# Patient Record
Sex: Male | Born: 1945
Health system: Southern US, Community
[De-identification: ages and names within clinical notes are randomized; demographics above are authoritative.]

## PROBLEM LIST (undated history)

## (undated) DIAGNOSIS — N289 Disorder of kidney and ureter, unspecified: Secondary | ICD-10-CM

## (undated) DIAGNOSIS — J449 Chronic obstructive pulmonary disease, unspecified: Secondary | ICD-10-CM

## (undated) DIAGNOSIS — I519 Heart disease, unspecified: Secondary | ICD-10-CM

## (undated) DIAGNOSIS — N189 Chronic kidney disease, unspecified: Secondary | ICD-10-CM

## (undated) DIAGNOSIS — I2510 Atherosclerotic heart disease of native coronary artery without angina pectoris: Secondary | ICD-10-CM

## (undated) DIAGNOSIS — G4730 Sleep apnea, unspecified: Secondary | ICD-10-CM

## (undated) DIAGNOSIS — K219 Gastro-esophageal reflux disease without esophagitis: Secondary | ICD-10-CM

## (undated) DIAGNOSIS — M109 Gout, unspecified: Secondary | ICD-10-CM

## (undated) DIAGNOSIS — E785 Hyperlipidemia, unspecified: Secondary | ICD-10-CM

## (undated) DIAGNOSIS — E119 Type 2 diabetes mellitus without complications: Secondary | ICD-10-CM

## (undated) DIAGNOSIS — I10 Essential (primary) hypertension: Secondary | ICD-10-CM

## (undated) HISTORY — DX: Essential (primary) hypertension: I10

## (undated) HISTORY — DX: Gastro-esophageal reflux disease without esophagitis: K21.9

## (undated) HISTORY — DX: Disorder of kidney and ureter, unspecified: N28.9

## (undated) HISTORY — DX: Atherosclerotic heart disease of native coronary artery without angina pectoris: I25.10

## (undated) HISTORY — DX: Sleep apnea, unspecified: G47.30

## (undated) HISTORY — DX: Chronic obstructive pulmonary disease, unspecified: J44.9

## (undated) HISTORY — DX: Chronic kidney disease, unspecified: N18.9

## (undated) HISTORY — DX: Heart disease, unspecified: I51.9

## (undated) HISTORY — DX: Gout, unspecified: M10.9

## (undated) HISTORY — DX: Hyperlipidemia, unspecified: E78.5

## (undated) HISTORY — DX: Type 2 diabetes mellitus without complications: E11.9

---

## 2011-01-23 DIAGNOSIS — N189 Chronic kidney disease, unspecified: Secondary | ICD-10-CM | POA: Diagnosis not present

## 2011-01-26 DIAGNOSIS — N189 Chronic kidney disease, unspecified: Secondary | ICD-10-CM | POA: Diagnosis not present

## 2011-02-27 DIAGNOSIS — M109 Gout, unspecified: Secondary | ICD-10-CM | POA: Diagnosis not present

## 2011-02-27 DIAGNOSIS — E782 Mixed hyperlipidemia: Secondary | ICD-10-CM | POA: Diagnosis not present

## 2011-02-27 DIAGNOSIS — N189 Chronic kidney disease, unspecified: Secondary | ICD-10-CM | POA: Diagnosis not present

## 2011-04-20 DIAGNOSIS — Z006 Encounter for examination for normal comparison and control in clinical research program: Secondary | ICD-10-CM | POA: Diagnosis not present

## 2011-04-20 DIAGNOSIS — M109 Gout, unspecified: Secondary | ICD-10-CM | POA: Diagnosis not present

## 2011-05-29 DIAGNOSIS — K219 Gastro-esophageal reflux disease without esophagitis: Secondary | ICD-10-CM | POA: Diagnosis not present

## 2011-05-29 DIAGNOSIS — I10 Essential (primary) hypertension: Secondary | ICD-10-CM | POA: Diagnosis not present

## 2011-05-29 DIAGNOSIS — N189 Chronic kidney disease, unspecified: Secondary | ICD-10-CM | POA: Diagnosis not present

## 2011-06-19 DIAGNOSIS — E1129 Type 2 diabetes mellitus with other diabetic kidney complication: Secondary | ICD-10-CM | POA: Diagnosis not present

## 2012-03-25 DIAGNOSIS — I498 Other specified cardiac arrhythmias: Secondary | ICD-10-CM | POA: Diagnosis not present

## 2012-03-25 DIAGNOSIS — R55 Syncope and collapse: Secondary | ICD-10-CM | POA: Diagnosis not present

## 2012-03-27 DIAGNOSIS — I498 Other specified cardiac arrhythmias: Secondary | ICD-10-CM | POA: Diagnosis not present

## 2012-03-27 DIAGNOSIS — I2510 Atherosclerotic heart disease of native coronary artery without angina pectoris: Secondary | ICD-10-CM | POA: Diagnosis not present

## 2014-01-13 DIAGNOSIS — E119 Type 2 diabetes mellitus without complications: Secondary | ICD-10-CM | POA: Diagnosis not present

## 2014-01-13 DIAGNOSIS — I10 Essential (primary) hypertension: Secondary | ICD-10-CM | POA: Diagnosis not present

## 2014-01-13 DIAGNOSIS — H26493 Other secondary cataract, bilateral: Secondary | ICD-10-CM | POA: Diagnosis not present

## 2014-01-13 DIAGNOSIS — Z961 Presence of intraocular lens: Secondary | ICD-10-CM | POA: Diagnosis not present

## 2014-01-13 DIAGNOSIS — E11319 Type 2 diabetes mellitus with unspecified diabetic retinopathy without macular edema: Secondary | ICD-10-CM | POA: Diagnosis not present

## 2014-01-13 DIAGNOSIS — H5203 Hypermetropia, bilateral: Secondary | ICD-10-CM | POA: Diagnosis not present

## 2014-01-13 DIAGNOSIS — H52223 Regular astigmatism, bilateral: Secondary | ICD-10-CM | POA: Diagnosis not present

## 2014-01-28 DIAGNOSIS — E1129 Type 2 diabetes mellitus with other diabetic kidney complication: Secondary | ICD-10-CM | POA: Diagnosis not present

## 2014-01-29 DIAGNOSIS — H26492 Other secondary cataract, left eye: Secondary | ICD-10-CM | POA: Diagnosis not present

## 2014-02-05 DIAGNOSIS — R001 Bradycardia, unspecified: Secondary | ICD-10-CM | POA: Diagnosis not present

## 2014-02-06 DIAGNOSIS — I10 Essential (primary) hypertension: Secondary | ICD-10-CM | POA: Diagnosis not present

## 2014-02-06 DIAGNOSIS — M542 Cervicalgia: Secondary | ICD-10-CM | POA: Diagnosis not present

## 2014-02-06 DIAGNOSIS — E1165 Type 2 diabetes mellitus with hyperglycemia: Secondary | ICD-10-CM | POA: Diagnosis not present

## 2014-02-06 DIAGNOSIS — Z0000 Encounter for general adult medical examination without abnormal findings: Secondary | ICD-10-CM | POA: Diagnosis not present

## 2014-02-06 DIAGNOSIS — E782 Mixed hyperlipidemia: Secondary | ICD-10-CM | POA: Diagnosis not present

## 2014-02-06 DIAGNOSIS — E1129 Type 2 diabetes mellitus with other diabetic kidney complication: Secondary | ICD-10-CM | POA: Diagnosis not present

## 2014-02-10 DIAGNOSIS — Z961 Presence of intraocular lens: Secondary | ICD-10-CM | POA: Diagnosis not present

## 2014-02-10 DIAGNOSIS — H26492 Other secondary cataract, left eye: Secondary | ICD-10-CM | POA: Diagnosis not present

## 2014-02-10 DIAGNOSIS — I10 Essential (primary) hypertension: Secondary | ICD-10-CM | POA: Diagnosis not present

## 2014-02-25 DIAGNOSIS — E780 Pure hypercholesterolemia: Secondary | ICD-10-CM | POA: Diagnosis not present

## 2014-02-25 DIAGNOSIS — Z794 Long term (current) use of insulin: Secondary | ICD-10-CM | POA: Diagnosis not present

## 2014-02-25 DIAGNOSIS — E1165 Type 2 diabetes mellitus with hyperglycemia: Secondary | ICD-10-CM | POA: Diagnosis not present

## 2014-02-25 DIAGNOSIS — I10 Essential (primary) hypertension: Secondary | ICD-10-CM | POA: Diagnosis not present

## 2014-02-26 DIAGNOSIS — I2510 Atherosclerotic heart disease of native coronary artery without angina pectoris: Secondary | ICD-10-CM | POA: Diagnosis not present

## 2014-03-03 DIAGNOSIS — S76019A Strain of muscle, fascia and tendon of unspecified hip, initial encounter: Secondary | ICD-10-CM | POA: Diagnosis not present

## 2014-03-06 DIAGNOSIS — E1165 Type 2 diabetes mellitus with hyperglycemia: Secondary | ICD-10-CM | POA: Diagnosis not present

## 2014-03-26 DIAGNOSIS — N183 Chronic kidney disease, stage 3 (moderate): Secondary | ICD-10-CM | POA: Diagnosis not present

## 2014-03-26 DIAGNOSIS — I129 Hypertensive chronic kidney disease with stage 1 through stage 4 chronic kidney disease, or unspecified chronic kidney disease: Secondary | ICD-10-CM | POA: Diagnosis not present

## 2014-03-26 DIAGNOSIS — E1122 Type 2 diabetes mellitus with diabetic chronic kidney disease: Secondary | ICD-10-CM | POA: Diagnosis not present

## 2014-05-08 DIAGNOSIS — I498 Other specified cardiac arrhythmias: Secondary | ICD-10-CM | POA: Diagnosis not present

## 2014-06-02 DIAGNOSIS — E1129 Type 2 diabetes mellitus with other diabetic kidney complication: Secondary | ICD-10-CM | POA: Diagnosis not present

## 2014-06-11 DIAGNOSIS — N183 Chronic kidney disease, stage 3 (moderate): Secondary | ICD-10-CM | POA: Diagnosis not present

## 2014-06-11 DIAGNOSIS — E1129 Type 2 diabetes mellitus with other diabetic kidney complication: Secondary | ICD-10-CM | POA: Diagnosis not present

## 2014-06-11 DIAGNOSIS — I10 Essential (primary) hypertension: Secondary | ICD-10-CM | POA: Diagnosis not present

## 2014-06-11 DIAGNOSIS — E1165 Type 2 diabetes mellitus with hyperglycemia: Secondary | ICD-10-CM | POA: Diagnosis not present

## 2014-06-15 DIAGNOSIS — N183 Chronic kidney disease, stage 3 (moderate): Secondary | ICD-10-CM | POA: Diagnosis not present

## 2014-06-29 DIAGNOSIS — Z794 Long term (current) use of insulin: Secondary | ICD-10-CM | POA: Diagnosis not present

## 2014-06-29 DIAGNOSIS — E1122 Type 2 diabetes mellitus with diabetic chronic kidney disease: Secondary | ICD-10-CM | POA: Diagnosis not present

## 2014-06-29 DIAGNOSIS — I129 Hypertensive chronic kidney disease with stage 1 through stage 4 chronic kidney disease, or unspecified chronic kidney disease: Secondary | ICD-10-CM | POA: Diagnosis not present

## 2014-06-29 DIAGNOSIS — E780 Pure hypercholesterolemia: Secondary | ICD-10-CM | POA: Diagnosis not present

## 2014-06-29 DIAGNOSIS — N183 Chronic kidney disease, stage 3 (moderate): Secondary | ICD-10-CM | POA: Diagnosis not present

## 2014-07-28 DIAGNOSIS — R1314 Dysphagia, pharyngoesophageal phase: Secondary | ICD-10-CM | POA: Diagnosis not present

## 2014-07-28 DIAGNOSIS — K210 Gastro-esophageal reflux disease with esophagitis: Secondary | ICD-10-CM | POA: Diagnosis not present

## 2014-07-31 DIAGNOSIS — K449 Diaphragmatic hernia without obstruction or gangrene: Secondary | ICD-10-CM | POA: Diagnosis not present

## 2014-07-31 DIAGNOSIS — R1310 Dysphagia, unspecified: Secondary | ICD-10-CM | POA: Diagnosis not present

## 2014-08-11 DIAGNOSIS — Z45018 Encounter for adjustment and management of other part of cardiac pacemaker: Secondary | ICD-10-CM | POA: Diagnosis not present

## 2014-08-11 DIAGNOSIS — I498 Other specified cardiac arrhythmias: Secondary | ICD-10-CM | POA: Diagnosis not present

## 2014-08-12 DIAGNOSIS — E785 Hyperlipidemia, unspecified: Secondary | ICD-10-CM | POA: Diagnosis not present

## 2014-08-12 DIAGNOSIS — N189 Chronic kidney disease, unspecified: Secondary | ICD-10-CM | POA: Diagnosis not present

## 2014-08-12 DIAGNOSIS — K449 Diaphragmatic hernia without obstruction or gangrene: Secondary | ICD-10-CM | POA: Diagnosis not present

## 2014-08-12 DIAGNOSIS — J449 Chronic obstructive pulmonary disease, unspecified: Secondary | ICD-10-CM | POA: Diagnosis not present

## 2014-08-12 DIAGNOSIS — K2970 Gastritis, unspecified, without bleeding: Secondary | ICD-10-CM | POA: Diagnosis not present

## 2014-08-12 DIAGNOSIS — I129 Hypertensive chronic kidney disease with stage 1 through stage 4 chronic kidney disease, or unspecified chronic kidney disease: Secondary | ICD-10-CM | POA: Diagnosis not present

## 2014-08-12 DIAGNOSIS — G4730 Sleep apnea, unspecified: Secondary | ICD-10-CM | POA: Diagnosis not present

## 2014-08-12 DIAGNOSIS — K210 Gastro-esophageal reflux disease with esophagitis: Secondary | ICD-10-CM | POA: Diagnosis not present

## 2014-08-12 DIAGNOSIS — Z87891 Personal history of nicotine dependence: Secondary | ICD-10-CM | POA: Diagnosis not present

## 2014-08-12 DIAGNOSIS — Z794 Long term (current) use of insulin: Secondary | ICD-10-CM | POA: Diagnosis not present

## 2014-08-12 DIAGNOSIS — I2510 Atherosclerotic heart disease of native coronary artery without angina pectoris: Secondary | ICD-10-CM | POA: Diagnosis not present

## 2014-08-12 DIAGNOSIS — K2900 Acute gastritis without bleeding: Secondary | ICD-10-CM | POA: Diagnosis not present

## 2014-08-12 DIAGNOSIS — E119 Type 2 diabetes mellitus without complications: Secondary | ICD-10-CM | POA: Diagnosis not present

## 2014-08-12 DIAGNOSIS — M109 Gout, unspecified: Secondary | ICD-10-CM | POA: Diagnosis not present

## 2014-08-12 DIAGNOSIS — R1314 Dysphagia, pharyngoesophageal phase: Secondary | ICD-10-CM | POA: Diagnosis not present

## 2014-08-17 DIAGNOSIS — I2510 Atherosclerotic heart disease of native coronary artery without angina pectoris: Secondary | ICD-10-CM | POA: Diagnosis not present

## 2014-08-17 DIAGNOSIS — I10 Essential (primary) hypertension: Secondary | ICD-10-CM | POA: Diagnosis not present

## 2014-08-17 DIAGNOSIS — E785 Hyperlipidemia, unspecified: Secondary | ICD-10-CM | POA: Diagnosis not present

## 2014-08-17 DIAGNOSIS — Z01810 Encounter for preprocedural cardiovascular examination: Secondary | ICD-10-CM | POA: Diagnosis not present

## 2014-08-19 DIAGNOSIS — K449 Diaphragmatic hernia without obstruction or gangrene: Secondary | ICD-10-CM | POA: Diagnosis not present

## 2014-08-19 DIAGNOSIS — I10 Essential (primary) hypertension: Secondary | ICD-10-CM | POA: Diagnosis not present

## 2014-08-19 DIAGNOSIS — I25119 Atherosclerotic heart disease of native coronary artery with unspecified angina pectoris: Secondary | ICD-10-CM | POA: Diagnosis not present

## 2014-08-19 DIAGNOSIS — E119 Type 2 diabetes mellitus without complications: Secondary | ICD-10-CM | POA: Diagnosis not present

## 2014-09-01 DIAGNOSIS — I2510 Atherosclerotic heart disease of native coronary artery without angina pectoris: Secondary | ICD-10-CM | POA: Diagnosis not present

## 2014-09-01 DIAGNOSIS — Z01810 Encounter for preprocedural cardiovascular examination: Secondary | ICD-10-CM | POA: Diagnosis not present

## 2014-09-07 DIAGNOSIS — E785 Hyperlipidemia, unspecified: Secondary | ICD-10-CM | POA: Diagnosis not present

## 2014-09-07 DIAGNOSIS — I2510 Atherosclerotic heart disease of native coronary artery without angina pectoris: Secondary | ICD-10-CM | POA: Diagnosis not present

## 2014-09-07 DIAGNOSIS — Z950 Presence of cardiac pacemaker: Secondary | ICD-10-CM | POA: Diagnosis not present

## 2014-09-07 DIAGNOSIS — I10 Essential (primary) hypertension: Secondary | ICD-10-CM | POA: Diagnosis not present

## 2014-09-08 HISTORY — PX: ESOPHAGOGASTRODUODENOSCOPY: SHX1529

## 2014-09-09 DIAGNOSIS — Z951 Presence of aortocoronary bypass graft: Secondary | ICD-10-CM | POA: Diagnosis not present

## 2014-09-09 DIAGNOSIS — I252 Old myocardial infarction: Secondary | ICD-10-CM | POA: Diagnosis not present

## 2014-09-09 DIAGNOSIS — R9439 Abnormal result of other cardiovascular function study: Secondary | ICD-10-CM | POA: Diagnosis not present

## 2014-09-09 DIAGNOSIS — E785 Hyperlipidemia, unspecified: Secondary | ICD-10-CM | POA: Diagnosis not present

## 2014-09-09 DIAGNOSIS — Z950 Presence of cardiac pacemaker: Secondary | ICD-10-CM | POA: Diagnosis not present

## 2014-09-09 DIAGNOSIS — R001 Bradycardia, unspecified: Secondary | ICD-10-CM | POA: Diagnosis not present

## 2014-09-09 DIAGNOSIS — I2510 Atherosclerotic heart disease of native coronary artery without angina pectoris: Secondary | ICD-10-CM | POA: Diagnosis not present

## 2014-09-09 DIAGNOSIS — N189 Chronic kidney disease, unspecified: Secondary | ICD-10-CM | POA: Diagnosis not present

## 2014-09-09 DIAGNOSIS — I2582 Chronic total occlusion of coronary artery: Secondary | ICD-10-CM | POA: Diagnosis not present

## 2014-09-09 DIAGNOSIS — K449 Diaphragmatic hernia without obstruction or gangrene: Secondary | ICD-10-CM | POA: Diagnosis not present

## 2014-09-09 DIAGNOSIS — I440 Atrioventricular block, first degree: Secondary | ICD-10-CM | POA: Diagnosis not present

## 2014-09-09 DIAGNOSIS — R1310 Dysphagia, unspecified: Secondary | ICD-10-CM | POA: Diagnosis not present

## 2014-09-09 DIAGNOSIS — E119 Type 2 diabetes mellitus without complications: Secondary | ICD-10-CM | POA: Diagnosis not present

## 2014-09-09 DIAGNOSIS — Z794 Long term (current) use of insulin: Secondary | ICD-10-CM | POA: Diagnosis not present

## 2014-09-09 DIAGNOSIS — H9190 Unspecified hearing loss, unspecified ear: Secondary | ICD-10-CM | POA: Diagnosis not present

## 2014-09-09 DIAGNOSIS — Z87891 Personal history of nicotine dependence: Secondary | ICD-10-CM | POA: Diagnosis not present

## 2014-09-09 DIAGNOSIS — R079 Chest pain, unspecified: Secondary | ICD-10-CM | POA: Diagnosis not present

## 2014-09-09 DIAGNOSIS — Z79899 Other long term (current) drug therapy: Secondary | ICD-10-CM | POA: Diagnosis not present

## 2014-09-09 DIAGNOSIS — I129 Hypertensive chronic kidney disease with stage 1 through stage 4 chronic kidney disease, or unspecified chronic kidney disease: Secondary | ICD-10-CM | POA: Diagnosis not present

## 2014-09-09 DIAGNOSIS — I25708 Atherosclerosis of coronary artery bypass graft(s), unspecified, with other forms of angina pectoris: Secondary | ICD-10-CM | POA: Diagnosis not present

## 2014-09-16 DIAGNOSIS — K449 Diaphragmatic hernia without obstruction or gangrene: Secondary | ICD-10-CM | POA: Diagnosis not present

## 2014-09-16 DIAGNOSIS — I25119 Atherosclerotic heart disease of native coronary artery with unspecified angina pectoris: Secondary | ICD-10-CM | POA: Diagnosis not present

## 2014-09-16 DIAGNOSIS — E119 Type 2 diabetes mellitus without complications: Secondary | ICD-10-CM | POA: Diagnosis not present

## 2014-09-16 DIAGNOSIS — I10 Essential (primary) hypertension: Secondary | ICD-10-CM | POA: Diagnosis not present

## 2014-09-28 DIAGNOSIS — Z794 Long term (current) use of insulin: Secondary | ICD-10-CM | POA: Diagnosis not present

## 2014-09-28 DIAGNOSIS — T8859XA Other complications of anesthesia, initial encounter: Secondary | ICD-10-CM | POA: Diagnosis not present

## 2014-09-28 DIAGNOSIS — E780 Pure hypercholesterolemia: Secondary | ICD-10-CM | POA: Diagnosis present

## 2014-09-28 DIAGNOSIS — G4733 Obstructive sleep apnea (adult) (pediatric): Secondary | ICD-10-CM | POA: Diagnosis not present

## 2014-09-28 DIAGNOSIS — K440 Diaphragmatic hernia with obstruction, without gangrene: Secondary | ICD-10-CM | POA: Diagnosis not present

## 2014-09-28 DIAGNOSIS — I2510 Atherosclerotic heart disease of native coronary artery without angina pectoris: Secondary | ICD-10-CM | POA: Diagnosis not present

## 2014-09-28 DIAGNOSIS — I509 Heart failure, unspecified: Secondary | ICD-10-CM | POA: Diagnosis present

## 2014-09-28 DIAGNOSIS — Z79899 Other long term (current) drug therapy: Secondary | ICD-10-CM | POA: Diagnosis not present

## 2014-09-28 DIAGNOSIS — J811 Chronic pulmonary edema: Secondary | ICD-10-CM | POA: Diagnosis not present

## 2014-09-28 DIAGNOSIS — I129 Hypertensive chronic kidney disease with stage 1 through stage 4 chronic kidney disease, or unspecified chronic kidney disease: Secondary | ICD-10-CM | POA: Diagnosis present

## 2014-09-28 DIAGNOSIS — Z951 Presence of aortocoronary bypass graft: Secondary | ICD-10-CM | POA: Diagnosis not present

## 2014-09-28 DIAGNOSIS — E119 Type 2 diabetes mellitus without complications: Secondary | ICD-10-CM | POA: Diagnosis present

## 2014-09-28 DIAGNOSIS — J9602 Acute respiratory failure with hypercapnia: Secondary | ICD-10-CM | POA: Diagnosis not present

## 2014-09-28 DIAGNOSIS — I252 Old myocardial infarction: Secondary | ICD-10-CM | POA: Diagnosis not present

## 2014-09-28 DIAGNOSIS — J449 Chronic obstructive pulmonary disease, unspecified: Secondary | ICD-10-CM | POA: Diagnosis present

## 2014-09-28 DIAGNOSIS — Z23 Encounter for immunization: Secondary | ICD-10-CM | POA: Diagnosis not present

## 2014-09-28 DIAGNOSIS — I517 Cardiomegaly: Secondary | ICD-10-CM | POA: Diagnosis not present

## 2014-09-28 DIAGNOSIS — Z87891 Personal history of nicotine dependence: Secondary | ICD-10-CM | POA: Diagnosis not present

## 2014-09-28 DIAGNOSIS — Z9861 Coronary angioplasty status: Secondary | ICD-10-CM | POA: Diagnosis not present

## 2014-09-28 DIAGNOSIS — T4145XA Adverse effect of unspecified anesthetic, initial encounter: Secondary | ICD-10-CM | POA: Diagnosis not present

## 2014-09-28 DIAGNOSIS — K449 Diaphragmatic hernia without obstruction or gangrene: Secondary | ICD-10-CM | POA: Diagnosis not present

## 2014-09-28 DIAGNOSIS — R1310 Dysphagia, unspecified: Secondary | ICD-10-CM | POA: Diagnosis not present

## 2014-09-28 DIAGNOSIS — I25810 Atherosclerosis of coronary artery bypass graft(s) without angina pectoris: Secondary | ICD-10-CM | POA: Diagnosis present

## 2014-09-28 DIAGNOSIS — N189 Chronic kidney disease, unspecified: Secondary | ICD-10-CM | POA: Diagnosis not present

## 2014-09-28 DIAGNOSIS — Z950 Presence of cardiac pacemaker: Secondary | ICD-10-CM | POA: Diagnosis not present

## 2014-10-05 DIAGNOSIS — I2510 Atherosclerotic heart disease of native coronary artery without angina pectoris: Secondary | ICD-10-CM | POA: Diagnosis not present

## 2014-10-05 DIAGNOSIS — Z950 Presence of cardiac pacemaker: Secondary | ICD-10-CM | POA: Diagnosis not present

## 2014-10-05 DIAGNOSIS — I10 Essential (primary) hypertension: Secondary | ICD-10-CM | POA: Diagnosis not present

## 2014-10-05 DIAGNOSIS — E785 Hyperlipidemia, unspecified: Secondary | ICD-10-CM | POA: Diagnosis not present

## 2014-10-20 DIAGNOSIS — E1129 Type 2 diabetes mellitus with other diabetic kidney complication: Secondary | ICD-10-CM | POA: Diagnosis not present

## 2014-10-27 DIAGNOSIS — G4733 Obstructive sleep apnea (adult) (pediatric): Secondary | ICD-10-CM | POA: Diagnosis not present

## 2014-10-27 DIAGNOSIS — E1165 Type 2 diabetes mellitus with hyperglycemia: Secondary | ICD-10-CM | POA: Diagnosis not present

## 2014-10-27 DIAGNOSIS — E1129 Type 2 diabetes mellitus with other diabetic kidney complication: Secondary | ICD-10-CM | POA: Diagnosis not present

## 2014-10-27 DIAGNOSIS — N183 Chronic kidney disease, stage 3 (moderate): Secondary | ICD-10-CM | POA: Diagnosis not present

## 2014-10-30 DIAGNOSIS — E1165 Type 2 diabetes mellitus with hyperglycemia: Secondary | ICD-10-CM | POA: Diagnosis not present

## 2014-10-30 DIAGNOSIS — E7800 Pure hypercholesterolemia, unspecified: Secondary | ICD-10-CM | POA: Diagnosis not present

## 2014-10-30 DIAGNOSIS — E1122 Type 2 diabetes mellitus with diabetic chronic kidney disease: Secondary | ICD-10-CM | POA: Diagnosis not present

## 2014-10-30 DIAGNOSIS — Z794 Long term (current) use of insulin: Secondary | ICD-10-CM | POA: Diagnosis not present

## 2014-10-30 DIAGNOSIS — N183 Chronic kidney disease, stage 3 (moderate): Secondary | ICD-10-CM | POA: Diagnosis not present

## 2014-10-30 DIAGNOSIS — I10 Essential (primary) hypertension: Secondary | ICD-10-CM | POA: Diagnosis not present

## 2014-11-03 DIAGNOSIS — M1611 Unilateral primary osteoarthritis, right hip: Secondary | ICD-10-CM | POA: Diagnosis not present

## 2014-11-06 ENCOUNTER — Other Ambulatory Visit: Payer: Self-pay | Admitting: Orthopedic Surgery

## 2014-11-06 DIAGNOSIS — M1611 Unilateral primary osteoarthritis, right hip: Secondary | ICD-10-CM

## 2014-11-11 ENCOUNTER — Ambulatory Visit
Admission: RE | Admit: 2014-11-11 | Discharge: 2014-11-11 | Disposition: A | Discharge status: Home / Self Care | Payer: Medicare Other | Source: Ambulatory Visit | Attending: Orthopedic Surgery | Admitting: Orthopedic Surgery

## 2014-11-11 DIAGNOSIS — R1030 Lower abdominal pain, unspecified: Secondary | ICD-10-CM | POA: Diagnosis not present

## 2014-11-11 DIAGNOSIS — M1611 Unilateral primary osteoarthritis, right hip: Secondary | ICD-10-CM

## 2014-11-20 DIAGNOSIS — M1611 Unilateral primary osteoarthritis, right hip: Secondary | ICD-10-CM | POA: Diagnosis not present

## 2014-11-26 DIAGNOSIS — S76011D Strain of muscle, fascia and tendon of right hip, subsequent encounter: Secondary | ICD-10-CM | POA: Diagnosis not present

## 2014-11-26 DIAGNOSIS — M25551 Pain in right hip: Secondary | ICD-10-CM | POA: Diagnosis not present

## 2015-01-25 DIAGNOSIS — Z79899 Other long term (current) drug therapy: Secondary | ICD-10-CM | POA: Diagnosis not present

## 2015-01-25 DIAGNOSIS — Z125 Encounter for screening for malignant neoplasm of prostate: Secondary | ICD-10-CM | POA: Diagnosis not present

## 2015-01-25 DIAGNOSIS — E1129 Type 2 diabetes mellitus with other diabetic kidney complication: Secondary | ICD-10-CM | POA: Diagnosis not present

## 2015-02-09 DIAGNOSIS — Z0000 Encounter for general adult medical examination without abnormal findings: Secondary | ICD-10-CM | POA: Diagnosis not present

## 2015-02-09 DIAGNOSIS — E1165 Type 2 diabetes mellitus with hyperglycemia: Secondary | ICD-10-CM | POA: Diagnosis not present

## 2015-02-09 DIAGNOSIS — E782 Mixed hyperlipidemia: Secondary | ICD-10-CM | POA: Diagnosis not present

## 2015-02-09 DIAGNOSIS — E1129 Type 2 diabetes mellitus with other diabetic kidney complication: Secondary | ICD-10-CM | POA: Diagnosis not present

## 2015-02-09 DIAGNOSIS — I10 Essential (primary) hypertension: Secondary | ICD-10-CM | POA: Diagnosis not present

## 2015-03-02 DIAGNOSIS — E782 Mixed hyperlipidemia: Secondary | ICD-10-CM | POA: Diagnosis not present

## 2015-03-02 DIAGNOSIS — E1122 Type 2 diabetes mellitus with diabetic chronic kidney disease: Secondary | ICD-10-CM | POA: Diagnosis not present

## 2015-03-02 DIAGNOSIS — E1165 Type 2 diabetes mellitus with hyperglycemia: Secondary | ICD-10-CM | POA: Diagnosis not present

## 2015-03-02 DIAGNOSIS — N183 Chronic kidney disease, stage 3 (moderate): Secondary | ICD-10-CM | POA: Diagnosis not present

## 2015-03-02 DIAGNOSIS — Z794 Long term (current) use of insulin: Secondary | ICD-10-CM | POA: Diagnosis not present

## 2015-03-02 DIAGNOSIS — I129 Hypertensive chronic kidney disease with stage 1 through stage 4 chronic kidney disease, or unspecified chronic kidney disease: Secondary | ICD-10-CM | POA: Diagnosis not present

## 2015-04-08 DIAGNOSIS — S60512A Abrasion of left hand, initial encounter: Secondary | ICD-10-CM | POA: Diagnosis not present

## 2015-05-14 DIAGNOSIS — Z950 Presence of cardiac pacemaker: Secondary | ICD-10-CM | POA: Diagnosis not present

## 2015-06-03 DIAGNOSIS — K625 Hemorrhage of anus and rectum: Secondary | ICD-10-CM | POA: Diagnosis not present

## 2015-06-14 DIAGNOSIS — E1129 Type 2 diabetes mellitus with other diabetic kidney complication: Secondary | ICD-10-CM | POA: Diagnosis not present

## 2015-06-21 DIAGNOSIS — E1165 Type 2 diabetes mellitus with hyperglycemia: Secondary | ICD-10-CM | POA: Diagnosis not present

## 2015-06-21 DIAGNOSIS — K219 Gastro-esophageal reflux disease without esophagitis: Secondary | ICD-10-CM | POA: Diagnosis not present

## 2015-06-21 DIAGNOSIS — E1129 Type 2 diabetes mellitus with other diabetic kidney complication: Secondary | ICD-10-CM | POA: Diagnosis not present

## 2015-06-21 DIAGNOSIS — N183 Chronic kidney disease, stage 3 (moderate): Secondary | ICD-10-CM | POA: Diagnosis not present

## 2015-07-22 DIAGNOSIS — N189 Chronic kidney disease, unspecified: Secondary | ICD-10-CM | POA: Diagnosis not present

## 2015-07-22 DIAGNOSIS — E119 Type 2 diabetes mellitus without complications: Secondary | ICD-10-CM | POA: Diagnosis not present

## 2015-07-22 DIAGNOSIS — G4733 Obstructive sleep apnea (adult) (pediatric): Secondary | ICD-10-CM | POA: Diagnosis not present

## 2015-07-22 DIAGNOSIS — Z950 Presence of cardiac pacemaker: Secondary | ICD-10-CM | POA: Diagnosis not present

## 2015-07-22 DIAGNOSIS — K5730 Diverticulosis of large intestine without perforation or abscess without bleeding: Secondary | ICD-10-CM | POA: Diagnosis not present

## 2015-07-22 DIAGNOSIS — J449 Chronic obstructive pulmonary disease, unspecified: Secondary | ICD-10-CM | POA: Diagnosis not present

## 2015-07-22 DIAGNOSIS — I25810 Atherosclerosis of coronary artery bypass graft(s) without angina pectoris: Secondary | ICD-10-CM | POA: Diagnosis not present

## 2015-07-22 DIAGNOSIS — K648 Other hemorrhoids: Secondary | ICD-10-CM | POA: Diagnosis not present

## 2015-07-22 DIAGNOSIS — K629 Disease of anus and rectum, unspecified: Secondary | ICD-10-CM | POA: Diagnosis not present

## 2015-07-22 DIAGNOSIS — K625 Hemorrhage of anus and rectum: Secondary | ICD-10-CM | POA: Diagnosis not present

## 2015-07-22 DIAGNOSIS — K219 Gastro-esophageal reflux disease without esophagitis: Secondary | ICD-10-CM | POA: Diagnosis not present

## 2015-07-22 DIAGNOSIS — Q438 Other specified congenital malformations of intestine: Secondary | ICD-10-CM | POA: Diagnosis not present

## 2015-07-26 DIAGNOSIS — I2510 Atherosclerotic heart disease of native coronary artery without angina pectoris: Secondary | ICD-10-CM | POA: Diagnosis not present

## 2015-07-26 DIAGNOSIS — I10 Essential (primary) hypertension: Secondary | ICD-10-CM | POA: Diagnosis not present

## 2015-07-26 DIAGNOSIS — Z45018 Encounter for adjustment and management of other part of cardiac pacemaker: Secondary | ICD-10-CM | POA: Diagnosis not present

## 2015-07-26 DIAGNOSIS — I495 Sick sinus syndrome: Secondary | ICD-10-CM | POA: Diagnosis not present

## 2015-07-26 DIAGNOSIS — E785 Hyperlipidemia, unspecified: Secondary | ICD-10-CM | POA: Diagnosis not present

## 2015-07-27 DIAGNOSIS — I2510 Atherosclerotic heart disease of native coronary artery without angina pectoris: Secondary | ICD-10-CM | POA: Diagnosis not present

## 2015-07-27 DIAGNOSIS — E119 Type 2 diabetes mellitus without complications: Secondary | ICD-10-CM | POA: Diagnosis not present

## 2015-07-28 DIAGNOSIS — I2510 Atherosclerotic heart disease of native coronary artery without angina pectoris: Secondary | ICD-10-CM | POA: Diagnosis not present

## 2015-07-29 DIAGNOSIS — R609 Edema, unspecified: Secondary | ICD-10-CM | POA: Diagnosis not present

## 2015-08-02 DIAGNOSIS — R609 Edema, unspecified: Secondary | ICD-10-CM | POA: Diagnosis not present

## 2015-08-31 DIAGNOSIS — Z794 Long term (current) use of insulin: Secondary | ICD-10-CM | POA: Diagnosis not present

## 2015-08-31 DIAGNOSIS — E1165 Type 2 diabetes mellitus with hyperglycemia: Secondary | ICD-10-CM | POA: Diagnosis not present

## 2015-08-31 DIAGNOSIS — I129 Hypertensive chronic kidney disease with stage 1 through stage 4 chronic kidney disease, or unspecified chronic kidney disease: Secondary | ICD-10-CM | POA: Diagnosis not present

## 2015-08-31 DIAGNOSIS — E782 Mixed hyperlipidemia: Secondary | ICD-10-CM | POA: Diagnosis not present

## 2015-08-31 DIAGNOSIS — N183 Chronic kidney disease, stage 3 (moderate): Secondary | ICD-10-CM | POA: Diagnosis not present

## 2015-08-31 DIAGNOSIS — E1122 Type 2 diabetes mellitus with diabetic chronic kidney disease: Secondary | ICD-10-CM | POA: Diagnosis not present

## 2015-09-07 DIAGNOSIS — E785 Hyperlipidemia, unspecified: Secondary | ICD-10-CM | POA: Diagnosis not present

## 2015-09-07 DIAGNOSIS — I2510 Atherosclerotic heart disease of native coronary artery without angina pectoris: Secondary | ICD-10-CM | POA: Diagnosis not present

## 2015-09-07 DIAGNOSIS — I10 Essential (primary) hypertension: Secondary | ICD-10-CM | POA: Diagnosis not present

## 2015-09-07 DIAGNOSIS — I495 Sick sinus syndrome: Secondary | ICD-10-CM | POA: Diagnosis not present

## 2015-09-07 DIAGNOSIS — Z45018 Encounter for adjustment and management of other part of cardiac pacemaker: Secondary | ICD-10-CM | POA: Diagnosis not present

## 2015-11-05 DIAGNOSIS — Z23 Encounter for immunization: Secondary | ICD-10-CM | POA: Diagnosis not present

## 2015-11-29 DIAGNOSIS — E1129 Type 2 diabetes mellitus with other diabetic kidney complication: Secondary | ICD-10-CM | POA: Diagnosis not present

## 2015-12-07 DIAGNOSIS — E1165 Type 2 diabetes mellitus with hyperglycemia: Secondary | ICD-10-CM | POA: Diagnosis not present

## 2015-12-07 DIAGNOSIS — N401 Enlarged prostate with lower urinary tract symptoms: Secondary | ICD-10-CM | POA: Diagnosis not present

## 2015-12-07 DIAGNOSIS — E1129 Type 2 diabetes mellitus with other diabetic kidney complication: Secondary | ICD-10-CM | POA: Diagnosis not present

## 2015-12-07 DIAGNOSIS — J0120 Acute ethmoidal sinusitis, unspecified: Secondary | ICD-10-CM | POA: Diagnosis not present

## 2016-01-11 DIAGNOSIS — M7542 Impingement syndrome of left shoulder: Secondary | ICD-10-CM | POA: Diagnosis not present

## 2016-02-17 DIAGNOSIS — Z45018 Encounter for adjustment and management of other part of cardiac pacemaker: Secondary | ICD-10-CM | POA: Diagnosis not present

## 2016-03-01 DIAGNOSIS — I2510 Atherosclerotic heart disease of native coronary artery without angina pectoris: Secondary | ICD-10-CM | POA: Diagnosis not present

## 2016-03-01 DIAGNOSIS — E785 Hyperlipidemia, unspecified: Secondary | ICD-10-CM | POA: Diagnosis not present

## 2016-03-01 DIAGNOSIS — I495 Sick sinus syndrome: Secondary | ICD-10-CM | POA: Diagnosis not present

## 2016-03-01 DIAGNOSIS — I10 Essential (primary) hypertension: Secondary | ICD-10-CM | POA: Diagnosis not present

## 2016-03-01 DIAGNOSIS — Z45018 Encounter for adjustment and management of other part of cardiac pacemaker: Secondary | ICD-10-CM | POA: Diagnosis not present

## 2016-03-02 DIAGNOSIS — N183 Chronic kidney disease, stage 3 (moderate): Secondary | ICD-10-CM | POA: Diagnosis not present

## 2016-03-02 DIAGNOSIS — Z794 Long term (current) use of insulin: Secondary | ICD-10-CM | POA: Diagnosis not present

## 2016-03-02 DIAGNOSIS — E782 Mixed hyperlipidemia: Secondary | ICD-10-CM | POA: Diagnosis not present

## 2016-03-02 DIAGNOSIS — E1122 Type 2 diabetes mellitus with diabetic chronic kidney disease: Secondary | ICD-10-CM | POA: Diagnosis not present

## 2016-03-02 DIAGNOSIS — E1165 Type 2 diabetes mellitus with hyperglycemia: Secondary | ICD-10-CM | POA: Diagnosis not present

## 2016-03-02 DIAGNOSIS — I129 Hypertensive chronic kidney disease with stage 1 through stage 4 chronic kidney disease, or unspecified chronic kidney disease: Secondary | ICD-10-CM | POA: Diagnosis not present

## 2016-03-08 DIAGNOSIS — E1129 Type 2 diabetes mellitus with other diabetic kidney complication: Secondary | ICD-10-CM | POA: Diagnosis not present

## 2016-03-09 DIAGNOSIS — E083211 Diabetes mellitus due to underlying condition with mild nonproliferative diabetic retinopathy with macular edema, right eye: Secondary | ICD-10-CM | POA: Diagnosis not present

## 2016-03-13 DIAGNOSIS — H918X1 Other specified hearing loss, right ear: Secondary | ICD-10-CM | POA: Diagnosis not present

## 2016-03-13 DIAGNOSIS — H903 Sensorineural hearing loss, bilateral: Secondary | ICD-10-CM | POA: Diagnosis not present

## 2016-03-13 DIAGNOSIS — H933X1 Disorders of right acoustic nerve: Secondary | ICD-10-CM | POA: Diagnosis not present

## 2016-03-14 DIAGNOSIS — I10 Essential (primary) hypertension: Secondary | ICD-10-CM | POA: Diagnosis not present

## 2016-03-14 DIAGNOSIS — E782 Mixed hyperlipidemia: Secondary | ICD-10-CM | POA: Diagnosis not present

## 2016-03-14 DIAGNOSIS — Z0000 Encounter for general adult medical examination without abnormal findings: Secondary | ICD-10-CM | POA: Diagnosis not present

## 2016-03-14 DIAGNOSIS — E1122 Type 2 diabetes mellitus with diabetic chronic kidney disease: Secondary | ICD-10-CM | POA: Diagnosis not present

## 2016-03-15 DIAGNOSIS — I6200 Nontraumatic subdural hemorrhage, unspecified: Secondary | ICD-10-CM | POA: Diagnosis not present

## 2016-03-15 DIAGNOSIS — H918X1 Other specified hearing loss, right ear: Secondary | ICD-10-CM | POA: Diagnosis not present

## 2016-03-15 DIAGNOSIS — H933X1 Disorders of right acoustic nerve: Secondary | ICD-10-CM | POA: Diagnosis not present

## 2016-03-23 DIAGNOSIS — Z6827 Body mass index (BMI) 27.0-27.9, adult: Secondary | ICD-10-CM | POA: Diagnosis not present

## 2016-03-23 DIAGNOSIS — J329 Chronic sinusitis, unspecified: Secondary | ICD-10-CM | POA: Diagnosis not present

## 2016-03-23 DIAGNOSIS — J40 Bronchitis, not specified as acute or chronic: Secondary | ICD-10-CM | POA: Diagnosis not present

## 2016-03-30 DIAGNOSIS — E083211 Diabetes mellitus due to underlying condition with mild nonproliferative diabetic retinopathy with macular edema, right eye: Secondary | ICD-10-CM | POA: Diagnosis not present

## 2016-03-31 DIAGNOSIS — I6203 Nontraumatic chronic subdural hemorrhage: Secondary | ICD-10-CM | POA: Diagnosis not present

## 2016-03-31 DIAGNOSIS — I6200 Nontraumatic subdural hemorrhage, unspecified: Secondary | ICD-10-CM | POA: Diagnosis not present

## 2016-04-11 DIAGNOSIS — I6200 Nontraumatic subdural hemorrhage, unspecified: Secondary | ICD-10-CM | POA: Diagnosis not present

## 2016-04-11 DIAGNOSIS — I6203 Nontraumatic chronic subdural hemorrhage: Secondary | ICD-10-CM | POA: Diagnosis not present

## 2016-04-25 DIAGNOSIS — M545 Low back pain: Secondary | ICD-10-CM | POA: Diagnosis not present

## 2016-04-25 DIAGNOSIS — I6203 Nontraumatic chronic subdural hemorrhage: Secondary | ICD-10-CM | POA: Diagnosis not present

## 2016-04-25 DIAGNOSIS — R2689 Other abnormalities of gait and mobility: Secondary | ICD-10-CM | POA: Diagnosis not present

## 2016-04-25 DIAGNOSIS — I6200 Nontraumatic subdural hemorrhage, unspecified: Secondary | ICD-10-CM | POA: Diagnosis not present

## 2016-05-02 DIAGNOSIS — R269 Unspecified abnormalities of gait and mobility: Secondary | ICD-10-CM | POA: Diagnosis not present

## 2016-05-02 DIAGNOSIS — M5441 Lumbago with sciatica, right side: Secondary | ICD-10-CM | POA: Diagnosis not present

## 2016-05-02 DIAGNOSIS — R200 Anesthesia of skin: Secondary | ICD-10-CM | POA: Diagnosis not present

## 2016-05-02 DIAGNOSIS — Z9181 History of falling: Secondary | ICD-10-CM | POA: Diagnosis not present

## 2016-05-02 DIAGNOSIS — R2689 Other abnormalities of gait and mobility: Secondary | ICD-10-CM | POA: Diagnosis not present

## 2016-05-02 DIAGNOSIS — G8929 Other chronic pain: Secondary | ICD-10-CM | POA: Diagnosis not present

## 2016-05-10 DIAGNOSIS — I700 Atherosclerosis of aorta: Secondary | ICD-10-CM | POA: Diagnosis not present

## 2016-05-10 DIAGNOSIS — M48061 Spinal stenosis, lumbar region without neurogenic claudication: Secondary | ICD-10-CM | POA: Diagnosis not present

## 2016-05-10 DIAGNOSIS — M4800 Spinal stenosis, site unspecified: Secondary | ICD-10-CM | POA: Diagnosis not present

## 2016-05-17 DIAGNOSIS — Z950 Presence of cardiac pacemaker: Secondary | ICD-10-CM | POA: Diagnosis not present

## 2016-05-30 DIAGNOSIS — M545 Low back pain: Secondary | ICD-10-CM | POA: Diagnosis not present

## 2016-05-30 DIAGNOSIS — I6200 Nontraumatic subdural hemorrhage, unspecified: Secondary | ICD-10-CM | POA: Diagnosis not present

## 2016-05-30 DIAGNOSIS — I6203 Nontraumatic chronic subdural hemorrhage: Secondary | ICD-10-CM | POA: Diagnosis not present

## 2016-05-30 DIAGNOSIS — R2689 Other abnormalities of gait and mobility: Secondary | ICD-10-CM | POA: Diagnosis not present

## 2016-05-30 DIAGNOSIS — R221 Localized swelling, mass and lump, neck: Secondary | ICD-10-CM | POA: Diagnosis not present

## 2016-05-31 DIAGNOSIS — J392 Other diseases of pharynx: Secondary | ICD-10-CM | POA: Diagnosis not present

## 2016-05-31 DIAGNOSIS — Z87891 Personal history of nicotine dependence: Secondary | ICD-10-CM | POA: Diagnosis not present

## 2016-05-31 DIAGNOSIS — D490 Neoplasm of unspecified behavior of digestive system: Secondary | ICD-10-CM | POA: Diagnosis not present

## 2016-08-22 DIAGNOSIS — E782 Mixed hyperlipidemia: Secondary | ICD-10-CM | POA: Diagnosis not present

## 2016-08-22 DIAGNOSIS — E1122 Type 2 diabetes mellitus with diabetic chronic kidney disease: Secondary | ICD-10-CM | POA: Diagnosis not present

## 2016-08-22 DIAGNOSIS — Z950 Presence of cardiac pacemaker: Secondary | ICD-10-CM | POA: Diagnosis not present

## 2016-08-29 DIAGNOSIS — I10 Essential (primary) hypertension: Secondary | ICD-10-CM | POA: Diagnosis not present

## 2016-08-29 DIAGNOSIS — K219 Gastro-esophageal reflux disease without esophagitis: Secondary | ICD-10-CM | POA: Diagnosis not present

## 2016-08-29 DIAGNOSIS — E1122 Type 2 diabetes mellitus with diabetic chronic kidney disease: Secondary | ICD-10-CM | POA: Diagnosis not present

## 2016-08-29 DIAGNOSIS — E782 Mixed hyperlipidemia: Secondary | ICD-10-CM | POA: Diagnosis not present

## 2016-08-30 DIAGNOSIS — E1165 Type 2 diabetes mellitus with hyperglycemia: Secondary | ICD-10-CM | POA: Diagnosis not present

## 2016-08-30 DIAGNOSIS — E1122 Type 2 diabetes mellitus with diabetic chronic kidney disease: Secondary | ICD-10-CM | POA: Diagnosis not present

## 2016-08-30 DIAGNOSIS — E782 Mixed hyperlipidemia: Secondary | ICD-10-CM | POA: Diagnosis not present

## 2016-08-30 DIAGNOSIS — I129 Hypertensive chronic kidney disease with stage 1 through stage 4 chronic kidney disease, or unspecified chronic kidney disease: Secondary | ICD-10-CM | POA: Diagnosis not present

## 2016-08-30 DIAGNOSIS — Z794 Long term (current) use of insulin: Secondary | ICD-10-CM | POA: Diagnosis not present

## 2016-08-30 DIAGNOSIS — N189 Chronic kidney disease, unspecified: Secondary | ICD-10-CM | POA: Diagnosis not present

## 2016-10-13 DIAGNOSIS — Z23 Encounter for immunization: Secondary | ICD-10-CM | POA: Diagnosis not present

## 2016-10-17 DIAGNOSIS — I2510 Atherosclerotic heart disease of native coronary artery without angina pectoris: Secondary | ICD-10-CM | POA: Diagnosis not present

## 2017-01-15 DIAGNOSIS — E1122 Type 2 diabetes mellitus with diabetic chronic kidney disease: Secondary | ICD-10-CM | POA: Diagnosis not present

## 2017-01-16 DIAGNOSIS — E083211 Diabetes mellitus due to underlying condition with mild nonproliferative diabetic retinopathy with macular edema, right eye: Secondary | ICD-10-CM | POA: Diagnosis not present

## 2017-01-23 DIAGNOSIS — K219 Gastro-esophageal reflux disease without esophagitis: Secondary | ICD-10-CM | POA: Diagnosis not present

## 2017-01-23 DIAGNOSIS — E782 Mixed hyperlipidemia: Secondary | ICD-10-CM | POA: Diagnosis not present

## 2017-01-23 DIAGNOSIS — M545 Low back pain: Secondary | ICD-10-CM | POA: Diagnosis not present

## 2017-01-23 DIAGNOSIS — E1122 Type 2 diabetes mellitus with diabetic chronic kidney disease: Secondary | ICD-10-CM | POA: Diagnosis not present

## 2017-01-30 DIAGNOSIS — K219 Gastro-esophageal reflux disease without esophagitis: Secondary | ICD-10-CM | POA: Diagnosis not present

## 2017-01-30 DIAGNOSIS — E1169 Type 2 diabetes mellitus with other specified complication: Secondary | ICD-10-CM | POA: Diagnosis not present

## 2017-01-30 DIAGNOSIS — I2510 Atherosclerotic heart disease of native coronary artery without angina pectoris: Secondary | ICD-10-CM | POA: Diagnosis not present

## 2017-02-05 DIAGNOSIS — I25768 Atherosclerosis of bypass graft of coronary artery of transplanted heart with other forms of angina pectoris: Secondary | ICD-10-CM | POA: Diagnosis not present

## 2017-02-05 DIAGNOSIS — I10 Essential (primary) hypertension: Secondary | ICD-10-CM | POA: Diagnosis not present

## 2017-02-05 DIAGNOSIS — I209 Angina pectoris, unspecified: Secondary | ICD-10-CM | POA: Diagnosis not present

## 2017-02-05 DIAGNOSIS — I480 Paroxysmal atrial fibrillation: Secondary | ICD-10-CM | POA: Diagnosis not present

## 2017-02-05 DIAGNOSIS — R1013 Epigastric pain: Secondary | ICD-10-CM | POA: Diagnosis not present

## 2017-02-05 DIAGNOSIS — R10816 Epigastric abdominal tenderness: Secondary | ICD-10-CM | POA: Diagnosis not present

## 2017-02-05 DIAGNOSIS — K210 Gastro-esophageal reflux disease with esophagitis: Secondary | ICD-10-CM | POA: Diagnosis not present

## 2017-02-05 DIAGNOSIS — I25118 Atherosclerotic heart disease of native coronary artery with other forms of angina pectoris: Secondary | ICD-10-CM | POA: Diagnosis not present

## 2017-02-09 DIAGNOSIS — I480 Paroxysmal atrial fibrillation: Secondary | ICD-10-CM | POA: Diagnosis not present

## 2017-02-09 DIAGNOSIS — I25810 Atherosclerosis of coronary artery bypass graft(s) without angina pectoris: Secondary | ICD-10-CM | POA: Diagnosis not present

## 2017-02-09 DIAGNOSIS — J811 Chronic pulmonary edema: Secondary | ICD-10-CM | POA: Diagnosis not present

## 2017-02-09 DIAGNOSIS — I2582 Chronic total occlusion of coronary artery: Secondary | ICD-10-CM | POA: Diagnosis not present

## 2017-02-09 DIAGNOSIS — I25118 Atherosclerotic heart disease of native coronary artery with other forms of angina pectoris: Secondary | ICD-10-CM | POA: Diagnosis not present

## 2017-02-10 DIAGNOSIS — I2582 Chronic total occlusion of coronary artery: Secondary | ICD-10-CM | POA: Diagnosis not present

## 2017-02-10 DIAGNOSIS — I25708 Atherosclerosis of coronary artery bypass graft(s), unspecified, with other forms of angina pectoris: Secondary | ICD-10-CM | POA: Diagnosis not present

## 2017-02-10 DIAGNOSIS — I480 Paroxysmal atrial fibrillation: Secondary | ICD-10-CM | POA: Diagnosis not present

## 2017-02-10 DIAGNOSIS — I25118 Atherosclerotic heart disease of native coronary artery with other forms of angina pectoris: Secondary | ICD-10-CM | POA: Diagnosis not present

## 2017-02-10 DIAGNOSIS — Z955 Presence of coronary angioplasty implant and graft: Secondary | ICD-10-CM | POA: Diagnosis not present

## 2017-02-10 DIAGNOSIS — I25810 Atherosclerosis of coronary artery bypass graft(s) without angina pectoris: Secondary | ICD-10-CM | POA: Diagnosis not present

## 2017-02-19 DIAGNOSIS — I480 Paroxysmal atrial fibrillation: Secondary | ICD-10-CM | POA: Diagnosis not present

## 2017-02-19 DIAGNOSIS — R079 Chest pain, unspecified: Secondary | ICD-10-CM | POA: Diagnosis not present

## 2017-02-19 DIAGNOSIS — I209 Angina pectoris, unspecified: Secondary | ICD-10-CM | POA: Diagnosis not present

## 2017-02-19 DIAGNOSIS — I10 Essential (primary) hypertension: Secondary | ICD-10-CM | POA: Diagnosis not present

## 2017-02-19 DIAGNOSIS — I25118 Atherosclerotic heart disease of native coronary artery with other forms of angina pectoris: Secondary | ICD-10-CM | POA: Diagnosis not present

## 2017-02-19 DIAGNOSIS — I491 Atrial premature depolarization: Secondary | ICD-10-CM | POA: Diagnosis not present

## 2017-02-22 DIAGNOSIS — R001 Bradycardia, unspecified: Secondary | ICD-10-CM | POA: Diagnosis not present

## 2017-02-22 DIAGNOSIS — Z950 Presence of cardiac pacemaker: Secondary | ICD-10-CM | POA: Diagnosis not present

## 2017-03-02 DIAGNOSIS — K210 Gastro-esophageal reflux disease with esophagitis: Secondary | ICD-10-CM | POA: Diagnosis not present

## 2017-03-05 DIAGNOSIS — E785 Hyperlipidemia, unspecified: Secondary | ICD-10-CM | POA: Diagnosis not present

## 2017-03-05 DIAGNOSIS — E1142 Type 2 diabetes mellitus with diabetic polyneuropathy: Secondary | ICD-10-CM | POA: Diagnosis not present

## 2017-03-05 DIAGNOSIS — I10 Essential (primary) hypertension: Secondary | ICD-10-CM | POA: Diagnosis not present

## 2017-03-05 DIAGNOSIS — Z794 Long term (current) use of insulin: Secondary | ICD-10-CM | POA: Diagnosis not present

## 2017-03-07 DIAGNOSIS — K449 Diaphragmatic hernia without obstruction or gangrene: Secondary | ICD-10-CM | POA: Diagnosis not present

## 2017-03-07 DIAGNOSIS — K219 Gastro-esophageal reflux disease without esophagitis: Secondary | ICD-10-CM | POA: Diagnosis not present

## 2017-05-24 DIAGNOSIS — Z950 Presence of cardiac pacemaker: Secondary | ICD-10-CM | POA: Diagnosis not present

## 2017-06-11 DIAGNOSIS — I10 Essential (primary) hypertension: Secondary | ICD-10-CM | POA: Diagnosis not present

## 2017-06-11 DIAGNOSIS — R079 Chest pain, unspecified: Secondary | ICD-10-CM | POA: Diagnosis not present

## 2017-06-11 DIAGNOSIS — I480 Paroxysmal atrial fibrillation: Secondary | ICD-10-CM | POA: Diagnosis not present

## 2017-06-11 DIAGNOSIS — I6522 Occlusion and stenosis of left carotid artery: Secondary | ICD-10-CM | POA: Diagnosis not present

## 2017-06-11 DIAGNOSIS — I25118 Atherosclerotic heart disease of native coronary artery with other forms of angina pectoris: Secondary | ICD-10-CM | POA: Diagnosis not present

## 2017-06-19 DIAGNOSIS — R079 Chest pain, unspecified: Secondary | ICD-10-CM | POA: Diagnosis not present

## 2017-07-13 DIAGNOSIS — Z79899 Other long term (current) drug therapy: Secondary | ICD-10-CM | POA: Diagnosis not present

## 2017-07-13 DIAGNOSIS — E1122 Type 2 diabetes mellitus with diabetic chronic kidney disease: Secondary | ICD-10-CM | POA: Diagnosis not present

## 2017-07-13 DIAGNOSIS — E782 Mixed hyperlipidemia: Secondary | ICD-10-CM | POA: Diagnosis not present

## 2017-07-18 DIAGNOSIS — E1122 Type 2 diabetes mellitus with diabetic chronic kidney disease: Secondary | ICD-10-CM | POA: Diagnosis not present

## 2017-07-18 DIAGNOSIS — Z0000 Encounter for general adult medical examination without abnormal findings: Secondary | ICD-10-CM | POA: Diagnosis not present

## 2017-07-18 DIAGNOSIS — N183 Chronic kidney disease, stage 3 (moderate): Secondary | ICD-10-CM | POA: Diagnosis not present

## 2017-07-18 DIAGNOSIS — I10 Essential (primary) hypertension: Secondary | ICD-10-CM | POA: Diagnosis not present

## 2017-07-18 DIAGNOSIS — E782 Mixed hyperlipidemia: Secondary | ICD-10-CM | POA: Diagnosis not present

## 2017-07-18 DIAGNOSIS — Z23 Encounter for immunization: Secondary | ICD-10-CM | POA: Diagnosis not present

## 2017-09-03 DIAGNOSIS — E1142 Type 2 diabetes mellitus with diabetic polyneuropathy: Secondary | ICD-10-CM | POA: Diagnosis not present

## 2017-09-03 DIAGNOSIS — Z794 Long term (current) use of insulin: Secondary | ICD-10-CM | POA: Diagnosis not present

## 2017-09-03 DIAGNOSIS — E782 Mixed hyperlipidemia: Secondary | ICD-10-CM | POA: Diagnosis not present

## 2017-09-03 DIAGNOSIS — I10 Essential (primary) hypertension: Secondary | ICD-10-CM | POA: Diagnosis not present

## 2017-09-17 DIAGNOSIS — N183 Chronic kidney disease, stage 3 (moderate): Secondary | ICD-10-CM | POA: Diagnosis not present

## 2017-10-15 DIAGNOSIS — Z23 Encounter for immunization: Secondary | ICD-10-CM | POA: Diagnosis not present

## 2017-11-28 ENCOUNTER — Other Ambulatory Visit: Payer: Self-pay

## 2017-12-17 DIAGNOSIS — L570 Actinic keratosis: Secondary | ICD-10-CM | POA: Diagnosis not present

## 2017-12-17 DIAGNOSIS — L578 Other skin changes due to chronic exposure to nonionizing radiation: Secondary | ICD-10-CM | POA: Diagnosis not present

## 2017-12-17 DIAGNOSIS — L821 Other seborrheic keratosis: Secondary | ICD-10-CM | POA: Diagnosis not present

## 2017-12-24 DIAGNOSIS — I25118 Atherosclerotic heart disease of native coronary artery with other forms of angina pectoris: Secondary | ICD-10-CM | POA: Diagnosis not present

## 2017-12-24 DIAGNOSIS — I480 Paroxysmal atrial fibrillation: Secondary | ICD-10-CM | POA: Diagnosis not present

## 2017-12-24 DIAGNOSIS — E7800 Pure hypercholesterolemia, unspecified: Secondary | ICD-10-CM | POA: Diagnosis not present

## 2017-12-24 DIAGNOSIS — I6522 Occlusion and stenosis of left carotid artery: Secondary | ICD-10-CM | POA: Diagnosis not present

## 2017-12-24 DIAGNOSIS — Z950 Presence of cardiac pacemaker: Secondary | ICD-10-CM | POA: Diagnosis not present

## 2017-12-24 DIAGNOSIS — Z951 Presence of aortocoronary bypass graft: Secondary | ICD-10-CM | POA: Diagnosis not present

## 2018-01-28 DIAGNOSIS — L988 Other specified disorders of the skin and subcutaneous tissue: Secondary | ICD-10-CM | POA: Diagnosis not present

## 2018-01-28 DIAGNOSIS — D481 Neoplasm of uncertain behavior of connective and other soft tissue: Secondary | ICD-10-CM | POA: Diagnosis not present

## 2018-01-28 DIAGNOSIS — H61001 Unspecified perichondritis of right external ear: Secondary | ICD-10-CM | POA: Diagnosis not present

## 2018-01-28 DIAGNOSIS — L570 Actinic keratosis: Secondary | ICD-10-CM | POA: Diagnosis not present

## 2018-02-12 DIAGNOSIS — E1122 Type 2 diabetes mellitus with diabetic chronic kidney disease: Secondary | ICD-10-CM | POA: Diagnosis not present

## 2018-02-19 DIAGNOSIS — E1122 Type 2 diabetes mellitus with diabetic chronic kidney disease: Secondary | ICD-10-CM | POA: Diagnosis not present

## 2018-02-19 DIAGNOSIS — E782 Mixed hyperlipidemia: Secondary | ICD-10-CM | POA: Diagnosis not present

## 2018-02-19 DIAGNOSIS — K219 Gastro-esophageal reflux disease without esophagitis: Secondary | ICD-10-CM | POA: Diagnosis not present

## 2018-02-19 DIAGNOSIS — I10 Essential (primary) hypertension: Secondary | ICD-10-CM | POA: Diagnosis not present

## 2018-02-20 DIAGNOSIS — C44202 Unspecified malignant neoplasm of skin of right ear and external auricular canal: Secondary | ICD-10-CM | POA: Diagnosis not present

## 2018-02-21 DIAGNOSIS — Z950 Presence of cardiac pacemaker: Secondary | ICD-10-CM | POA: Diagnosis not present

## 2018-03-06 DIAGNOSIS — E7800 Pure hypercholesterolemia, unspecified: Secondary | ICD-10-CM | POA: Diagnosis not present

## 2018-03-06 DIAGNOSIS — Z794 Long term (current) use of insulin: Secondary | ICD-10-CM | POA: Diagnosis not present

## 2018-03-06 DIAGNOSIS — I10 Essential (primary) hypertension: Secondary | ICD-10-CM | POA: Diagnosis not present

## 2018-03-06 DIAGNOSIS — E1165 Type 2 diabetes mellitus with hyperglycemia: Secondary | ICD-10-CM | POA: Diagnosis not present

## 2018-03-07 DIAGNOSIS — Z45018 Encounter for adjustment and management of other part of cardiac pacemaker: Secondary | ICD-10-CM | POA: Diagnosis not present

## 2018-03-07 DIAGNOSIS — R001 Bradycardia, unspecified: Secondary | ICD-10-CM | POA: Diagnosis not present

## 2018-06-06 DIAGNOSIS — L821 Other seborrheic keratosis: Secondary | ICD-10-CM | POA: Diagnosis not present

## 2018-06-06 DIAGNOSIS — L570 Actinic keratosis: Secondary | ICD-10-CM | POA: Diagnosis not present

## 2018-06-06 DIAGNOSIS — L219 Seborrheic dermatitis, unspecified: Secondary | ICD-10-CM | POA: Diagnosis not present

## 2018-06-06 DIAGNOSIS — L578 Other skin changes due to chronic exposure to nonionizing radiation: Secondary | ICD-10-CM | POA: Diagnosis not present

## 2018-06-06 DIAGNOSIS — Z950 Presence of cardiac pacemaker: Secondary | ICD-10-CM | POA: Diagnosis not present

## 2018-07-05 DIAGNOSIS — I10 Essential (primary) hypertension: Secondary | ICD-10-CM | POA: Diagnosis not present

## 2018-07-05 DIAGNOSIS — I25118 Atherosclerotic heart disease of native coronary artery with other forms of angina pectoris: Secondary | ICD-10-CM | POA: Diagnosis not present

## 2018-07-05 DIAGNOSIS — Z951 Presence of aortocoronary bypass graft: Secondary | ICD-10-CM | POA: Diagnosis not present

## 2018-07-05 DIAGNOSIS — Z955 Presence of coronary angioplasty implant and graft: Secondary | ICD-10-CM | POA: Diagnosis not present

## 2018-07-05 DIAGNOSIS — E782 Mixed hyperlipidemia: Secondary | ICD-10-CM | POA: Diagnosis not present

## 2018-07-05 DIAGNOSIS — I480 Paroxysmal atrial fibrillation: Secondary | ICD-10-CM | POA: Diagnosis not present

## 2018-07-05 DIAGNOSIS — Z950 Presence of cardiac pacemaker: Secondary | ICD-10-CM | POA: Diagnosis not present

## 2018-08-02 DIAGNOSIS — K449 Diaphragmatic hernia without obstruction or gangrene: Secondary | ICD-10-CM | POA: Diagnosis not present

## 2018-08-02 DIAGNOSIS — S00531A Contusion of lip, initial encounter: Secondary | ICD-10-CM | POA: Diagnosis not present

## 2018-08-02 DIAGNOSIS — K529 Noninfective gastroenteritis and colitis, unspecified: Secondary | ICD-10-CM | POA: Diagnosis not present

## 2018-08-02 DIAGNOSIS — R531 Weakness: Secondary | ICD-10-CM | POA: Diagnosis not present

## 2018-08-02 DIAGNOSIS — I5043 Acute on chronic combined systolic (congestive) and diastolic (congestive) heart failure: Secondary | ICD-10-CM | POA: Diagnosis not present

## 2018-08-02 DIAGNOSIS — R112 Nausea with vomiting, unspecified: Secondary | ICD-10-CM | POA: Diagnosis not present

## 2018-08-02 DIAGNOSIS — R197 Diarrhea, unspecified: Secondary | ICD-10-CM | POA: Diagnosis not present

## 2018-08-02 DIAGNOSIS — K6389 Other specified diseases of intestine: Secondary | ICD-10-CM | POA: Diagnosis not present

## 2018-08-03 DIAGNOSIS — K449 Diaphragmatic hernia without obstruction or gangrene: Secondary | ICD-10-CM | POA: Diagnosis not present

## 2018-08-03 DIAGNOSIS — I5040 Unspecified combined systolic (congestive) and diastolic (congestive) heart failure: Secondary | ICD-10-CM | POA: Diagnosis present

## 2018-08-03 DIAGNOSIS — Z7982 Long term (current) use of aspirin: Secondary | ICD-10-CM | POA: Diagnosis not present

## 2018-08-03 DIAGNOSIS — R197 Diarrhea, unspecified: Secondary | ICD-10-CM | POA: Diagnosis not present

## 2018-08-03 DIAGNOSIS — I2510 Atherosclerotic heart disease of native coronary artery without angina pectoris: Secondary | ICD-10-CM | POA: Diagnosis present

## 2018-08-03 DIAGNOSIS — Z87891 Personal history of nicotine dependence: Secondary | ICD-10-CM | POA: Diagnosis not present

## 2018-08-03 DIAGNOSIS — I340 Nonrheumatic mitral (valve) insufficiency: Secondary | ICD-10-CM | POA: Diagnosis not present

## 2018-08-03 DIAGNOSIS — Z7902 Long term (current) use of antithrombotics/antiplatelets: Secondary | ICD-10-CM | POA: Diagnosis not present

## 2018-08-03 DIAGNOSIS — S01511A Laceration without foreign body of lip, initial encounter: Secondary | ICD-10-CM | POA: Diagnosis present

## 2018-08-03 DIAGNOSIS — I361 Nonrheumatic tricuspid (valve) insufficiency: Secondary | ICD-10-CM | POA: Diagnosis not present

## 2018-08-03 DIAGNOSIS — I252 Old myocardial infarction: Secondary | ICD-10-CM | POA: Diagnosis not present

## 2018-08-03 DIAGNOSIS — R112 Nausea with vomiting, unspecified: Secondary | ICD-10-CM | POA: Diagnosis not present

## 2018-08-03 DIAGNOSIS — E860 Dehydration: Secondary | ICD-10-CM | POA: Diagnosis present

## 2018-08-03 DIAGNOSIS — N183 Chronic kidney disease, stage 3 (moderate): Secondary | ICD-10-CM | POA: Diagnosis present

## 2018-08-03 DIAGNOSIS — N179 Acute kidney failure, unspecified: Secondary | ICD-10-CM | POA: Diagnosis not present

## 2018-08-03 DIAGNOSIS — A045 Campylobacter enteritis: Secondary | ICD-10-CM | POA: Diagnosis not present

## 2018-08-03 DIAGNOSIS — I6523 Occlusion and stenosis of bilateral carotid arteries: Secondary | ICD-10-CM | POA: Diagnosis present

## 2018-08-03 DIAGNOSIS — I959 Hypotension, unspecified: Secondary | ICD-10-CM | POA: Diagnosis present

## 2018-08-03 DIAGNOSIS — R55 Syncope and collapse: Secondary | ICD-10-CM | POA: Diagnosis present

## 2018-08-03 DIAGNOSIS — M1990 Unspecified osteoarthritis, unspecified site: Secondary | ICD-10-CM | POA: Diagnosis present

## 2018-08-03 DIAGNOSIS — Z951 Presence of aortocoronary bypass graft: Secondary | ICD-10-CM | POA: Diagnosis not present

## 2018-08-03 DIAGNOSIS — I272 Pulmonary hypertension, unspecified: Secondary | ICD-10-CM | POA: Diagnosis present

## 2018-08-03 DIAGNOSIS — R531 Weakness: Secondary | ICD-10-CM | POA: Diagnosis not present

## 2018-08-03 DIAGNOSIS — J449 Chronic obstructive pulmonary disease, unspecified: Secondary | ICD-10-CM | POA: Diagnosis present

## 2018-08-03 DIAGNOSIS — K529 Noninfective gastroenteritis and colitis, unspecified: Secondary | ICD-10-CM | POA: Diagnosis not present

## 2018-08-03 DIAGNOSIS — E119 Type 2 diabetes mellitus without complications: Secondary | ICD-10-CM | POA: Diagnosis not present

## 2018-08-03 DIAGNOSIS — K6389 Other specified diseases of intestine: Secondary | ICD-10-CM | POA: Diagnosis not present

## 2018-08-03 DIAGNOSIS — I071 Rheumatic tricuspid insufficiency: Secondary | ICD-10-CM | POA: Diagnosis present

## 2018-08-03 DIAGNOSIS — I130 Hypertensive heart and chronic kidney disease with heart failure and stage 1 through stage 4 chronic kidney disease, or unspecified chronic kidney disease: Secondary | ICD-10-CM | POA: Diagnosis present

## 2018-08-03 DIAGNOSIS — I509 Heart failure, unspecified: Secondary | ICD-10-CM | POA: Diagnosis not present

## 2018-08-03 DIAGNOSIS — E1122 Type 2 diabetes mellitus with diabetic chronic kidney disease: Secondary | ICD-10-CM | POA: Diagnosis present

## 2018-08-03 DIAGNOSIS — Z79899 Other long term (current) drug therapy: Secondary | ICD-10-CM | POA: Diagnosis not present

## 2018-08-03 DIAGNOSIS — S00531A Contusion of lip, initial encounter: Secondary | ICD-10-CM | POA: Diagnosis not present

## 2018-08-03 DIAGNOSIS — I371 Nonrheumatic pulmonary valve insufficiency: Secondary | ICD-10-CM | POA: Diagnosis not present

## 2018-08-03 DIAGNOSIS — Z794 Long term (current) use of insulin: Secondary | ICD-10-CM | POA: Diagnosis not present

## 2018-08-13 DIAGNOSIS — A048 Other specified bacterial intestinal infections: Secondary | ICD-10-CM | POA: Diagnosis not present

## 2018-08-13 DIAGNOSIS — N183 Chronic kidney disease, stage 3 (moderate): Secondary | ICD-10-CM | POA: Diagnosis not present

## 2018-08-13 DIAGNOSIS — I6529 Occlusion and stenosis of unspecified carotid artery: Secondary | ICD-10-CM | POA: Diagnosis not present

## 2018-08-13 DIAGNOSIS — I10 Essential (primary) hypertension: Secondary | ICD-10-CM | POA: Diagnosis not present

## 2018-08-16 ENCOUNTER — Encounter: Payer: Self-pay | Admitting: Gastroenterology

## 2018-08-21 ENCOUNTER — Telehealth: Payer: Self-pay

## 2018-08-21 NOTE — Telephone Encounter (Signed)
Covid-19 screening questions   Do you now or have you had a fever in the last 14 days? No  Do you have any respiratory symptoms of shortness of breath or cough now or in the last 14 days? No  Do you have any family members or close contacts with diagnosed or suspected Covid-19 in the past 14 days? No  Have you been tested for Covid-19 and found to be positive? Was tested in August and found to be negative

## 2018-08-23 ENCOUNTER — Ambulatory Visit (INDEPENDENT_AMBULATORY_CARE_PROVIDER_SITE_OTHER): Payer: Medicare Other | Admitting: Gastroenterology

## 2018-08-23 ENCOUNTER — Other Ambulatory Visit: Payer: Self-pay

## 2018-08-23 VITALS — BP 108/70 | HR 84 | Temp 98.0°F | Ht 68.0 in | Wt 158.0 lb

## 2018-08-23 DIAGNOSIS — A045 Campylobacter enteritis: Secondary | ICD-10-CM | POA: Diagnosis not present

## 2018-08-23 MED ORDER — ONDANSETRON HCL 4 MG PO TABS: 4.0000 mg | ORAL_TABLET | Freq: Two times a day (BID) | ORAL | 2 refills | Status: DC

## 2018-08-23 NOTE — Progress Notes (Signed)
Chief Complaint: FU  Referring Provider:  Mateo Flow, MD      ASSESSMENT AND PLAN;   #1. GERD with paraesophageal hernia s/p repair with Nissen's fundoplication 06/107.  #2. H/O Camplobacter colitis (treated with azithromycin) Adm to [facility] 7/25-7/27/2020. CT AP acute colitis, small HH.  #3. Nausea  #4.  Comorbid conditions include CAD s/p CABG, carotid artery dz, HTN, R sided CHF (EF 55-60%, mod PAH, TR 07/2018), COPD, CKD3, DM2, OA, pacemaker.  Plan: - Protonix 40mg  po bid to continue for now.  Patient reluctant to cut down on the dose. - Zofran 4mg  po Q6hrs prn #30, 2 refills - Has appt with Dr Otho Perl (cardiology) in coming days. - FU in 12 weeks. If still with problems, procced with EGD/colon after holding plavix 5 days and cardio clearance.  If still with problems, would proceed with solid-phase GES.   HPI:    Johnny Santiago is a 73 y.o. male  Follow-up from recent hospitalization at [facility] Admitted with acute gastroenteritis confirmed on noncontrast CT 08/03/2018 Found to have Campylobacter Treated with IV Zosyn followed by azithromycin p.o. with good results.  He does feel better.  Diarrhea has considerably improved.  Occasional abdominal discomfort.  Has nausea but no vomiting.  No fever or chills.  He also had syncope on admission.  Cardiac work-up included 2D echo which revealed normal ejection fraction, pulmonary artery hypertension, moderate tricuspid regurgitation.  Carotid ultrasound showed bilateral carotid artery disease.  He is maintained on Plavix.  He has follow-up appointment with Dr. Raquel Sarna cardiology in coming days.  BNP was elevated.  Review of labs: 08/05/2018 hemoglobin 12.6, platelets 160, BUN 17, creatinine 1.2   Past GI procedures: -Colonoscopy 07/22/2015-pancolonic diverticulosis, internal hemorrhoids.  Limited due to quality of preparation. -EGD 08/12/2014-hiatal hernia with paraesophageal component (before Nissen) -Upper GI series 03/07/2017: Reflux,  nonspecific esophageal dysmotility, status post Nissen's fundoplication  Past Medical History:  Diagnosis Date  . CAD (coronary artery disease)   . CKD (chronic kidney disease)   . COPD (chronic obstructive pulmonary disease) ([place])   . GERD (gastroesophageal reflux disease)   . Gout   . Heart disease   . Hyperlipidemia   . Hypertension   . Renal insufficiency   . Sleep apnea   . Type 2 diabetes mellitus ([place])     Past Surgical History:  Procedure Laterality Date  . CATARACT EXTRACTION    . COLONOSCOPY  12/24/2003   Mild sigmoid diverticulosis. Internal hemorrhoids. Otherwise normal colonoscopy to cecum.   . CORONARY ARTERY BYPASS GRAFT     x4  . CORONARY STENT PLACEMENT    . ESOPHAGOGASTRODUODENOSCOPY  09/08/2014   Large hiatal hernia eith paraesophageal component. Patient high risk volvulus. Mild gastritis.   Marland Kitchen PACEMAKER IMPLANT    . PARAESOPHAGEAL HERNIA REPAIR    . SHOULDER SURGERY    . Surgery for Sleep Apnea     Tonsillectomy and septoplast and treacheostomy    Family History  Problem Relation Age of Onset  . Colon cancer Neg Hx   . Esophageal cancer Neg Hx     Social History   Tobacco Use  . Smoking status: Former Research scientist (life sciences)  . Smokeless tobacco: Never Used  Substance Use Topics  . Alcohol use: Not Currently  . Drug use: Never    Current Outpatient Medications  Medication Sig Dispense Refill  . atorvastatin (LIPITOR) 40 MG tablet 1 tablet daily.    . clopidogrel (PLAVIX) 75 MG tablet 1 tablet daily.    Marland Kitchen  empagliflozin (JARDIANCE) 10 MG TABS tablet Take 10 mg by mouth daily.    . ferrous sulfate 325 (65 FE) MG tablet Take 325 mg by mouth daily.    . furosemide (LASIX) 80 MG tablet Take 80 mg by mouth 2 (two) times daily.    . insulin regular human CONCENTRATED (HUMULIN R U-500 KWIKPEN) 500 UNIT/ML kwikpen Inject into the skin 3 (three) times daily. On a sliding scale    . isosorbide mononitrate (IMDUR) 60 MG 24 hr tablet Take 60 mg by mouth daily.    .  nitroGLYCERIN (NITROSTAT) 0.4 MG SL tablet Place 0.4 mg under the tongue as needed.    . pantoprazole (PROTONIX) 40 MG tablet 1 tablet 2 (two) times daily.    . Potassium 99 MG TABS Take 1 tablet by mouth daily.    Marland Kitchen spironolactone (ALDACTONE) 25 MG tablet 1 tablet daily.    . tamsulosin (FLOMAX) 0.4 MG CAPS capsule 1 capsule daily.     No current facility-administered medications for this visit.     Not on File  Review of Systems:  Constitutional: Denies fever, chills, diaphoresis, appetite change and fatigue.  HEENT: Denies photophobia, eye pain, redness, hearing loss, ear pain, congestion, sore throat, rhinorrhea, sneezing, mouth sores, neck pain, neck stiffness and tinnitus.   Respiratory: Denies SOB, DOE, cough, chest tightness,  and wheezing.   Cardiovascular: Denies chest pain, palpitations and leg swelling.  Genitourinary: Denies dysuria, urgency, frequency, hematuria, flank pain and difficulty urinating.  Musculoskeletal: Denies myalgias, back pain, joint swelling, arthralgias and gait problem.  Skin: No rash.  Neurological: Denies dizziness, seizures, syncope, weakness, light-headedness, numbness and headaches.  Hematological: Denies adenopathy. Easy bruising, personal or family bleeding history  Psychiatric/Behavioral: No anxiety or depression     Physical Exam:    BP 108/70   Pulse 84   Temp 98 F (36.7 C)   Ht 5\' 8"  (1.727 m)   Wt 158 lb (71.7 kg)   BMI 24.02 kg/m  Filed Weights   08/23/18 1028  Weight: 158 lb (71.7 kg)   Constitutional:  Well-developed, in no acute distress. Psychiatric: Normal mood and affect. Behavior is normal. HEENT: Pupils normal.  Conjunctivae are normal. No scleral icterus. Neck supple.  Cardiovascular: Normal rate, regular rhythm. No edema Pulmonary/chest: Effort normal and breath sounds normal. No wheezing, rales or rhonchi. Abdominal: Soft, nondistended. Nontender. Bowel sounds active throughout. There are no masses palpable. No  hepatomegaly. Rectal:  defered Neurological: Alert and oriented to person place and time. Skin: Skin is warm and dry. No rashes noted.  Data Reviewed: I have personally reviewed following labs and imaging studies  Extensive notes were reviewed.   Carmell Austria, MD 08/23/2018, 10:59 AM  Cc: Mateo Flow, MD

## 2018-08-23 NOTE — Patient Instructions (Signed)
Continue Protonix 40 mg twice a day   We have sent the following medications to your pharmacy for you to pick up at your convenience: Zofran 4 mg every 6 hours as needed

## 2018-09-02 ENCOUNTER — Telehealth: Payer: Self-pay | Admitting: Gastroenterology

## 2018-09-02 DIAGNOSIS — Z955 Presence of coronary angioplasty implant and graft: Secondary | ICD-10-CM | POA: Diagnosis not present

## 2018-09-02 DIAGNOSIS — I480 Paroxysmal atrial fibrillation: Secondary | ICD-10-CM | POA: Diagnosis not present

## 2018-09-02 DIAGNOSIS — I209 Angina pectoris, unspecified: Secondary | ICD-10-CM | POA: Diagnosis not present

## 2018-09-02 DIAGNOSIS — Z951 Presence of aortocoronary bypass graft: Secondary | ICD-10-CM | POA: Diagnosis not present

## 2018-09-02 DIAGNOSIS — I2510 Atherosclerotic heart disease of native coronary artery without angina pectoris: Secondary | ICD-10-CM | POA: Diagnosis not present

## 2018-09-02 DIAGNOSIS — A045 Campylobacter enteritis: Secondary | ICD-10-CM

## 2018-09-02 DIAGNOSIS — E782 Mixed hyperlipidemia: Secondary | ICD-10-CM | POA: Diagnosis not present

## 2018-09-02 DIAGNOSIS — I10 Essential (primary) hypertension: Secondary | ICD-10-CM | POA: Diagnosis not present

## 2018-09-02 DIAGNOSIS — Z950 Presence of cardiac pacemaker: Secondary | ICD-10-CM | POA: Diagnosis not present

## 2018-09-02 NOTE — Telephone Encounter (Signed)
Called and spoke with patient- patient reports on/off nausea/vomiting with diarrhea- abdominal pain (RLQ/LLQ below the naval is "really bad pain"; ate chicken pie, buttered beans, macaroni salad prior to the pain starting;;  Tried Zofran -no relief from it; still taking Protonix; does not know what he is doing to make his "stomach feel so bad"; not taking any OTC medications;  Please advise

## 2018-09-02 NOTE — Telephone Encounter (Signed)
Pt reported that he is still having symptoms of diarrhea and vomiting from food poisoning.  Please advise.

## 2018-09-03 DIAGNOSIS — Z125 Encounter for screening for malignant neoplasm of prostate: Secondary | ICD-10-CM | POA: Diagnosis not present

## 2018-09-03 DIAGNOSIS — Z79899 Other long term (current) drug therapy: Secondary | ICD-10-CM | POA: Diagnosis not present

## 2018-09-03 DIAGNOSIS — E782 Mixed hyperlipidemia: Secondary | ICD-10-CM | POA: Diagnosis not present

## 2018-09-03 DIAGNOSIS — E1122 Type 2 diabetes mellitus with diabetic chronic kidney disease: Secondary | ICD-10-CM | POA: Diagnosis not present

## 2018-09-03 MED ORDER — DICYCLOMINE HCL 10 MG PO CAPS: 10.0000 mg | ORAL_CAPSULE | Freq: Two times a day (BID) | ORAL | 4 refills | Status: DC

## 2018-09-03 NOTE — Telephone Encounter (Signed)
Called and spoke with patient and patient's wife-they were informed of result note and MD recommendations; patient is agreeable with plan of care; RX sent to pharmacy of patient choice; orders for labs also placed in Epic; Patient verbalized understanding of information/instructions;  Patient was advised to call the office at [phone] if questions/concerns arise;

## 2018-09-03 NOTE — Telephone Encounter (Signed)
Recently had Campylobacter colitis, treated with azithromycin. Certainly, could have recurrence/ postinfectious IBS/rule out C. Difficile.  Plan: -Stool studies for GI pathogens, WBCs.  Also do fecal elastase. -Also check CBC, CMP, CRP -Start Bentyl 10 mg p.o. twice daily half an hour before lunch and at bedtime. 60, 4 refills. -Let us know how he is in 2 weeks. -Keep well-hydrated.  Drink plenty of water.  Thx  RG

## 2018-09-04 ENCOUNTER — Other Ambulatory Visit: Payer: Medicare Other

## 2018-09-04 DIAGNOSIS — I10 Essential (primary) hypertension: Secondary | ICD-10-CM | POA: Diagnosis not present

## 2018-09-04 DIAGNOSIS — Z794 Long term (current) use of insulin: Secondary | ICD-10-CM | POA: Diagnosis not present

## 2018-09-04 DIAGNOSIS — E782 Mixed hyperlipidemia: Secondary | ICD-10-CM | POA: Diagnosis not present

## 2018-09-04 DIAGNOSIS — E1142 Type 2 diabetes mellitus with diabetic polyneuropathy: Secondary | ICD-10-CM | POA: Diagnosis not present

## 2018-09-05 ENCOUNTER — Other Ambulatory Visit: Payer: Medicare Other

## 2018-09-05 DIAGNOSIS — A045 Campylobacter enteritis: Secondary | ICD-10-CM | POA: Diagnosis not present

## 2018-09-05 DIAGNOSIS — Z950 Presence of cardiac pacemaker: Secondary | ICD-10-CM | POA: Diagnosis not present

## 2018-09-11 DIAGNOSIS — I259 Chronic ischemic heart disease, unspecified: Secondary | ICD-10-CM | POA: Diagnosis not present

## 2018-09-11 DIAGNOSIS — N183 Chronic kidney disease, stage 3 (moderate): Secondary | ICD-10-CM | POA: Diagnosis not present

## 2018-09-11 DIAGNOSIS — E782 Mixed hyperlipidemia: Secondary | ICD-10-CM | POA: Diagnosis not present

## 2018-09-11 DIAGNOSIS — Z0000 Encounter for general adult medical examination without abnormal findings: Secondary | ICD-10-CM | POA: Diagnosis not present

## 2018-09-11 DIAGNOSIS — Z23 Encounter for immunization: Secondary | ICD-10-CM | POA: Diagnosis not present

## 2018-09-11 DIAGNOSIS — E1122 Type 2 diabetes mellitus with diabetic chronic kidney disease: Secondary | ICD-10-CM | POA: Diagnosis not present

## 2018-09-11 LAB — GASTROINTESTINAL PATHOGEN PANEL PCR
C. difficile Tox A/B, PCR: NOT DETECTED
Campylobacter, PCR: NOT DETECTED
Cryptosporidium, PCR: NOT DETECTED
E coli (ETEC) LT/ST PCR: NOT DETECTED
E coli (STEC) stx1/stx2, PCR: NOT DETECTED
E coli 0157, PCR: NOT DETECTED
Giardia lamblia, PCR: NOT DETECTED
Norovirus, PCR: NOT DETECTED
Rotavirus A, PCR: NOT DETECTED
Salmonella, PCR: NOT DETECTED
Shigella, PCR: NOT DETECTED

## 2018-09-11 LAB — FECAL LACTOFERRIN, QUANT
Fecal Lactoferrin: POSITIVE — AB
MICRO NUMBER:: 819026
SPECIMEN QUALITY:: ADEQUATE

## 2018-09-11 LAB — PANCREATIC ELASTASE, FECAL: Pancreatic Elastase-1, Stool: >500 mcg/g

## 2018-09-12 ENCOUNTER — Telehealth: Payer: Self-pay | Admitting: Gastroenterology

## 2018-09-12 NOTE — Telephone Encounter (Signed)
Please see additional documentation concerning this patient 

## 2018-09-19 DIAGNOSIS — Z955 Presence of coronary angioplasty implant and graft: Secondary | ICD-10-CM | POA: Diagnosis not present

## 2018-09-19 DIAGNOSIS — I25708 Atherosclerosis of coronary artery bypass graft(s), unspecified, with other forms of angina pectoris: Secondary | ICD-10-CM | POA: Diagnosis not present

## 2018-09-19 DIAGNOSIS — I25118 Atherosclerotic heart disease of native coronary artery with other forms of angina pectoris: Secondary | ICD-10-CM | POA: Diagnosis not present

## 2018-09-19 DIAGNOSIS — Z01818 Encounter for other preprocedural examination: Secondary | ICD-10-CM | POA: Diagnosis not present

## 2018-09-19 DIAGNOSIS — T82855A Stenosis of coronary artery stent, initial encounter: Secondary | ICD-10-CM | POA: Diagnosis not present

## 2018-09-19 DIAGNOSIS — I25700 Atherosclerosis of coronary artery bypass graft(s), unspecified, with unstable angina pectoris: Secondary | ICD-10-CM | POA: Diagnosis not present

## 2018-09-19 DIAGNOSIS — I25119 Atherosclerotic heart disease of native coronary artery with unspecified angina pectoris: Secondary | ICD-10-CM | POA: Diagnosis not present

## 2018-09-19 DIAGNOSIS — I2582 Chronic total occlusion of coronary artery: Secondary | ICD-10-CM | POA: Diagnosis not present

## 2018-10-07 DIAGNOSIS — I10 Essential (primary) hypertension: Secondary | ICD-10-CM | POA: Diagnosis not present

## 2018-10-07 DIAGNOSIS — I209 Angina pectoris, unspecified: Secondary | ICD-10-CM | POA: Diagnosis not present

## 2018-10-07 DIAGNOSIS — Z950 Presence of cardiac pacemaker: Secondary | ICD-10-CM | POA: Diagnosis not present

## 2018-10-07 DIAGNOSIS — E782 Mixed hyperlipidemia: Secondary | ICD-10-CM | POA: Diagnosis not present

## 2018-10-07 DIAGNOSIS — Z955 Presence of coronary angioplasty implant and graft: Secondary | ICD-10-CM | POA: Diagnosis not present

## 2018-10-07 DIAGNOSIS — I480 Paroxysmal atrial fibrillation: Secondary | ICD-10-CM | POA: Diagnosis not present

## 2018-10-07 DIAGNOSIS — Z951 Presence of aortocoronary bypass graft: Secondary | ICD-10-CM | POA: Diagnosis not present

## 2018-10-07 DIAGNOSIS — I495 Sick sinus syndrome: Secondary | ICD-10-CM | POA: Diagnosis not present

## 2018-10-07 DIAGNOSIS — I2510 Atherosclerotic heart disease of native coronary artery without angina pectoris: Secondary | ICD-10-CM | POA: Diagnosis not present

## 2018-11-01 DIAGNOSIS — I495 Sick sinus syndrome: Secondary | ICD-10-CM | POA: Diagnosis not present

## 2018-11-01 DIAGNOSIS — Z955 Presence of coronary angioplasty implant and graft: Secondary | ICD-10-CM | POA: Diagnosis not present

## 2018-11-01 DIAGNOSIS — Z950 Presence of cardiac pacemaker: Secondary | ICD-10-CM | POA: Diagnosis not present

## 2018-11-01 DIAGNOSIS — E785 Hyperlipidemia, unspecified: Secondary | ICD-10-CM | POA: Diagnosis not present

## 2018-11-01 DIAGNOSIS — I6523 Occlusion and stenosis of bilateral carotid arteries: Secondary | ICD-10-CM | POA: Diagnosis not present

## 2018-11-01 DIAGNOSIS — Z951 Presence of aortocoronary bypass graft: Secondary | ICD-10-CM | POA: Diagnosis not present

## 2018-11-01 DIAGNOSIS — I209 Angina pectoris, unspecified: Secondary | ICD-10-CM | POA: Diagnosis not present

## 2018-11-01 DIAGNOSIS — R079 Chest pain, unspecified: Secondary | ICD-10-CM | POA: Diagnosis not present

## 2018-11-01 DIAGNOSIS — I480 Paroxysmal atrial fibrillation: Secondary | ICD-10-CM | POA: Diagnosis not present

## 2018-11-01 DIAGNOSIS — I2510 Atherosclerotic heart disease of native coronary artery without angina pectoris: Secondary | ICD-10-CM | POA: Diagnosis not present

## 2018-11-03 DIAGNOSIS — I498 Other specified cardiac arrhythmias: Secondary | ICD-10-CM | POA: Diagnosis not present

## 2018-11-07 DIAGNOSIS — I25119 Atherosclerotic heart disease of native coronary artery with unspecified angina pectoris: Secondary | ICD-10-CM | POA: Diagnosis present

## 2018-11-07 DIAGNOSIS — I498 Other specified cardiac arrhythmias: Secondary | ICD-10-CM | POA: Diagnosis not present

## 2018-11-07 DIAGNOSIS — I340 Nonrheumatic mitral (valve) insufficiency: Secondary | ICD-10-CM | POA: Diagnosis not present

## 2018-11-07 DIAGNOSIS — T82855A Stenosis of coronary artery stent, initial encounter: Secondary | ICD-10-CM | POA: Diagnosis not present

## 2018-11-07 DIAGNOSIS — Z955 Presence of coronary angioplasty implant and graft: Secondary | ICD-10-CM | POA: Diagnosis not present

## 2018-11-07 DIAGNOSIS — I447 Left bundle-branch block, unspecified: Secondary | ICD-10-CM | POA: Diagnosis present

## 2018-11-07 DIAGNOSIS — I2510 Atherosclerotic heart disease of native coronary artery without angina pectoris: Secondary | ICD-10-CM | POA: Diagnosis not present

## 2018-11-07 DIAGNOSIS — E782 Mixed hyperlipidemia: Secondary | ICD-10-CM | POA: Diagnosis not present

## 2018-11-07 DIAGNOSIS — N183 Chronic kidney disease, stage 3 unspecified: Secondary | ICD-10-CM | POA: Diagnosis not present

## 2018-11-07 DIAGNOSIS — E1122 Type 2 diabetes mellitus with diabetic chronic kidney disease: Secondary | ICD-10-CM | POA: Diagnosis not present

## 2018-11-08 DIAGNOSIS — E1122 Type 2 diabetes mellitus with diabetic chronic kidney disease: Secondary | ICD-10-CM | POA: Diagnosis not present

## 2018-11-08 DIAGNOSIS — N183 Chronic kidney disease, stage 3 unspecified: Secondary | ICD-10-CM | POA: Diagnosis not present

## 2018-11-08 DIAGNOSIS — I498 Other specified cardiac arrhythmias: Secondary | ICD-10-CM | POA: Diagnosis not present

## 2018-11-08 DIAGNOSIS — E782 Mixed hyperlipidemia: Secondary | ICD-10-CM | POA: Diagnosis not present

## 2018-11-12 DIAGNOSIS — Z7982 Long term (current) use of aspirin: Secondary | ICD-10-CM | POA: Diagnosis not present

## 2018-11-12 DIAGNOSIS — I6523 Occlusion and stenosis of bilateral carotid arteries: Secondary | ICD-10-CM | POA: Diagnosis not present

## 2018-11-12 DIAGNOSIS — Z7902 Long term (current) use of antithrombotics/antiplatelets: Secondary | ICD-10-CM | POA: Diagnosis not present

## 2018-11-14 DIAGNOSIS — Z9181 History of falling: Secondary | ICD-10-CM | POA: Diagnosis not present

## 2018-11-14 DIAGNOSIS — I259 Chronic ischemic heart disease, unspecified: Secondary | ICD-10-CM | POA: Diagnosis not present

## 2018-11-14 DIAGNOSIS — Z1331 Encounter for screening for depression: Secondary | ICD-10-CM | POA: Diagnosis not present

## 2018-11-14 DIAGNOSIS — N1831 Chronic kidney disease, stage 3a: Secondary | ICD-10-CM | POA: Diagnosis not present

## 2018-12-06 DIAGNOSIS — Z950 Presence of cardiac pacemaker: Secondary | ICD-10-CM | POA: Diagnosis not present

## 2018-12-20 DIAGNOSIS — I2510 Atherosclerotic heart disease of native coronary artery without angina pectoris: Secondary | ICD-10-CM | POA: Diagnosis not present

## 2018-12-20 DIAGNOSIS — I480 Paroxysmal atrial fibrillation: Secondary | ICD-10-CM | POA: Diagnosis not present

## 2018-12-20 DIAGNOSIS — I495 Sick sinus syndrome: Secondary | ICD-10-CM | POA: Diagnosis not present

## 2018-12-20 DIAGNOSIS — Z951 Presence of aortocoronary bypass graft: Secondary | ICD-10-CM | POA: Diagnosis not present

## 2018-12-20 DIAGNOSIS — Z87891 Personal history of nicotine dependence: Secondary | ICD-10-CM | POA: Diagnosis not present

## 2018-12-20 DIAGNOSIS — I10 Essential (primary) hypertension: Secondary | ICD-10-CM | POA: Diagnosis not present

## 2018-12-20 DIAGNOSIS — Z955 Presence of coronary angioplasty implant and graft: Secondary | ICD-10-CM | POA: Diagnosis not present

## 2018-12-20 DIAGNOSIS — Z950 Presence of cardiac pacemaker: Secondary | ICD-10-CM | POA: Diagnosis not present

## 2019-01-27 DIAGNOSIS — E1122 Type 2 diabetes mellitus with diabetic chronic kidney disease: Secondary | ICD-10-CM | POA: Diagnosis not present

## 2019-01-27 DIAGNOSIS — E782 Mixed hyperlipidemia: Secondary | ICD-10-CM | POA: Diagnosis not present

## 2019-01-27 DIAGNOSIS — Z79899 Other long term (current) drug therapy: Secondary | ICD-10-CM | POA: Diagnosis not present

## 2019-01-29 ENCOUNTER — Other Ambulatory Visit: Payer: Self-pay

## 2019-01-29 DIAGNOSIS — A045 Campylobacter enteritis: Secondary | ICD-10-CM

## 2019-01-29 MED ORDER — DICYCLOMINE HCL 10 MG PO CAPS: 10.0000 mg | ORAL_CAPSULE | Freq: Two times a day (BID) | ORAL | 4 refills | Status: DC

## 2019-02-03 DIAGNOSIS — I10 Essential (primary) hypertension: Secondary | ICD-10-CM | POA: Diagnosis not present

## 2019-02-03 DIAGNOSIS — E1122 Type 2 diabetes mellitus with diabetic chronic kidney disease: Secondary | ICD-10-CM | POA: Diagnosis not present

## 2019-02-03 DIAGNOSIS — I259 Chronic ischemic heart disease, unspecified: Secondary | ICD-10-CM | POA: Diagnosis not present

## 2019-02-03 DIAGNOSIS — N183 Chronic kidney disease, stage 3 unspecified: Secondary | ICD-10-CM | POA: Diagnosis not present

## 2019-02-18 DIAGNOSIS — I214 Non-ST elevation (NSTEMI) myocardial infarction: Secondary | ICD-10-CM | POA: Diagnosis not present

## 2019-02-18 DIAGNOSIS — E1165 Type 2 diabetes mellitus with hyperglycemia: Secondary | ICD-10-CM | POA: Diagnosis not present

## 2019-02-18 DIAGNOSIS — I495 Sick sinus syndrome: Secondary | ICD-10-CM | POA: Diagnosis not present

## 2019-02-18 DIAGNOSIS — R072 Precordial pain: Secondary | ICD-10-CM | POA: Diagnosis not present

## 2019-02-18 DIAGNOSIS — R079 Chest pain, unspecified: Secondary | ICD-10-CM | POA: Diagnosis not present

## 2019-02-18 DIAGNOSIS — N1832 Chronic kidney disease, stage 3b: Secondary | ICD-10-CM | POA: Diagnosis not present

## 2019-02-18 DIAGNOSIS — Z87891 Personal history of nicotine dependence: Secondary | ICD-10-CM | POA: Diagnosis not present

## 2019-02-18 DIAGNOSIS — I208 Other forms of angina pectoris: Secondary | ICD-10-CM | POA: Diagnosis not present

## 2019-02-18 DIAGNOSIS — I25700 Atherosclerosis of coronary artery bypass graft(s), unspecified, with unstable angina pectoris: Secondary | ICD-10-CM | POA: Diagnosis not present

## 2019-02-18 DIAGNOSIS — E785 Hyperlipidemia, unspecified: Secondary | ICD-10-CM | POA: Diagnosis present

## 2019-02-18 DIAGNOSIS — E1122 Type 2 diabetes mellitus with diabetic chronic kidney disease: Secondary | ICD-10-CM | POA: Diagnosis not present

## 2019-02-18 DIAGNOSIS — Z955 Presence of coronary angioplasty implant and graft: Secondary | ICD-10-CM | POA: Diagnosis not present

## 2019-02-18 DIAGNOSIS — Z794 Long term (current) use of insulin: Secondary | ICD-10-CM | POA: Diagnosis not present

## 2019-02-18 DIAGNOSIS — I25110 Atherosclerotic heart disease of native coronary artery with unstable angina pectoris: Secondary | ICD-10-CM | POA: Diagnosis not present

## 2019-02-18 DIAGNOSIS — I252 Old myocardial infarction: Secondary | ICD-10-CM | POA: Diagnosis not present

## 2019-02-18 DIAGNOSIS — Z950 Presence of cardiac pacemaker: Secondary | ICD-10-CM | POA: Diagnosis not present

## 2019-02-18 DIAGNOSIS — Z8639 Personal history of other endocrine, nutritional and metabolic disease: Secondary | ICD-10-CM | POA: Diagnosis not present

## 2019-02-18 DIAGNOSIS — Z951 Presence of aortocoronary bypass graft: Secondary | ICD-10-CM | POA: Diagnosis not present

## 2019-02-18 DIAGNOSIS — Z7982 Long term (current) use of aspirin: Secondary | ICD-10-CM | POA: Diagnosis not present

## 2019-02-18 DIAGNOSIS — I209 Angina pectoris, unspecified: Secondary | ICD-10-CM | POA: Diagnosis not present

## 2019-02-18 DIAGNOSIS — I480 Paroxysmal atrial fibrillation: Secondary | ICD-10-CM | POA: Diagnosis present

## 2019-02-18 DIAGNOSIS — Z8679 Personal history of other diseases of the circulatory system: Secondary | ICD-10-CM | POA: Diagnosis not present

## 2019-02-18 DIAGNOSIS — R0689 Other abnormalities of breathing: Secondary | ICD-10-CM | POA: Diagnosis not present

## 2019-02-18 DIAGNOSIS — E1142 Type 2 diabetes mellitus with diabetic polyneuropathy: Secondary | ICD-10-CM | POA: Diagnosis not present

## 2019-02-18 DIAGNOSIS — N183 Chronic kidney disease, stage 3 unspecified: Secondary | ICD-10-CM | POA: Diagnosis not present

## 2019-02-18 DIAGNOSIS — R0789 Other chest pain: Secondary | ICD-10-CM | POA: Diagnosis not present

## 2019-02-18 DIAGNOSIS — I129 Hypertensive chronic kidney disease with stage 1 through stage 4 chronic kidney disease, or unspecified chronic kidney disease: Secondary | ICD-10-CM | POA: Diagnosis present

## 2019-02-18 DIAGNOSIS — Z7902 Long term (current) use of antithrombotics/antiplatelets: Secondary | ICD-10-CM | POA: Diagnosis not present

## 2019-02-18 DIAGNOSIS — R0902 Hypoxemia: Secondary | ICD-10-CM | POA: Diagnosis not present

## 2019-02-18 DIAGNOSIS — I213 ST elevation (STEMI) myocardial infarction of unspecified site: Secondary | ICD-10-CM | POA: Diagnosis not present

## 2019-02-18 DIAGNOSIS — R0602 Shortness of breath: Secondary | ICD-10-CM | POA: Diagnosis not present

## 2019-02-18 DIAGNOSIS — R112 Nausea with vomiting, unspecified: Secondary | ICD-10-CM | POA: Diagnosis not present

## 2019-02-19 DIAGNOSIS — N1832 Chronic kidney disease, stage 3b: Secondary | ICD-10-CM | POA: Diagnosis not present

## 2019-02-19 DIAGNOSIS — I129 Hypertensive chronic kidney disease with stage 1 through stage 4 chronic kidney disease, or unspecified chronic kidney disease: Secondary | ICD-10-CM | POA: Diagnosis not present

## 2019-02-19 DIAGNOSIS — E1122 Type 2 diabetes mellitus with diabetic chronic kidney disease: Secondary | ICD-10-CM | POA: Diagnosis not present

## 2019-02-19 DIAGNOSIS — Z950 Presence of cardiac pacemaker: Secondary | ICD-10-CM | POA: Diagnosis not present

## 2019-02-19 DIAGNOSIS — I25700 Atherosclerosis of coronary artery bypass graft(s), unspecified, with unstable angina pectoris: Secondary | ICD-10-CM | POA: Diagnosis not present

## 2019-03-05 DIAGNOSIS — Z23 Encounter for immunization: Secondary | ICD-10-CM | POA: Diagnosis not present

## 2019-03-10 DIAGNOSIS — I10 Essential (primary) hypertension: Secondary | ICD-10-CM | POA: Diagnosis not present

## 2019-03-10 DIAGNOSIS — E782 Mixed hyperlipidemia: Secondary | ICD-10-CM | POA: Diagnosis not present

## 2019-03-10 DIAGNOSIS — Z794 Long term (current) use of insulin: Secondary | ICD-10-CM | POA: Diagnosis not present

## 2019-03-10 DIAGNOSIS — E1142 Type 2 diabetes mellitus with diabetic polyneuropathy: Secondary | ICD-10-CM | POA: Diagnosis not present

## 2019-03-19 DIAGNOSIS — I495 Sick sinus syndrome: Secondary | ICD-10-CM | POA: Diagnosis not present

## 2019-03-19 DIAGNOSIS — Z950 Presence of cardiac pacemaker: Secondary | ICD-10-CM | POA: Diagnosis not present

## 2019-03-19 DIAGNOSIS — I209 Angina pectoris, unspecified: Secondary | ICD-10-CM | POA: Diagnosis not present

## 2019-03-19 DIAGNOSIS — I480 Paroxysmal atrial fibrillation: Secondary | ICD-10-CM | POA: Diagnosis not present

## 2019-03-19 DIAGNOSIS — Z951 Presence of aortocoronary bypass graft: Secondary | ICD-10-CM | POA: Diagnosis not present

## 2019-03-19 DIAGNOSIS — Z955 Presence of coronary angioplasty implant and graft: Secondary | ICD-10-CM | POA: Diagnosis not present

## 2019-03-19 DIAGNOSIS — I2510 Atherosclerotic heart disease of native coronary artery without angina pectoris: Secondary | ICD-10-CM | POA: Diagnosis not present

## 2019-03-20 DIAGNOSIS — I495 Sick sinus syndrome: Secondary | ICD-10-CM | POA: Diagnosis not present

## 2019-03-20 DIAGNOSIS — Z45018 Encounter for adjustment and management of other part of cardiac pacemaker: Secondary | ICD-10-CM | POA: Diagnosis not present

## 2019-04-03 DIAGNOSIS — Z23 Encounter for immunization: Secondary | ICD-10-CM | POA: Diagnosis not present

## 2019-04-27 DIAGNOSIS — L298 Other pruritus: Secondary | ICD-10-CM | POA: Diagnosis not present

## 2019-04-27 DIAGNOSIS — L299 Pruritus, unspecified: Secondary | ICD-10-CM | POA: Diagnosis not present

## 2019-05-01 DIAGNOSIS — Z950 Presence of cardiac pacemaker: Secondary | ICD-10-CM | POA: Diagnosis not present

## 2019-05-05 DIAGNOSIS — L299 Pruritus, unspecified: Secondary | ICD-10-CM | POA: Diagnosis not present

## 2019-05-05 DIAGNOSIS — Z6823 Body mass index (BMI) 23.0-23.9, adult: Secondary | ICD-10-CM | POA: Diagnosis not present

## 2019-05-06 DIAGNOSIS — E119 Type 2 diabetes mellitus without complications: Secondary | ICD-10-CM | POA: Diagnosis not present

## 2019-05-12 DIAGNOSIS — I495 Sick sinus syndrome: Secondary | ICD-10-CM | POA: Diagnosis not present

## 2019-05-12 DIAGNOSIS — I10 Essential (primary) hypertension: Secondary | ICD-10-CM | POA: Diagnosis not present

## 2019-05-12 DIAGNOSIS — Z951 Presence of aortocoronary bypass graft: Secondary | ICD-10-CM | POA: Diagnosis not present

## 2019-05-12 DIAGNOSIS — Z955 Presence of coronary angioplasty implant and graft: Secondary | ICD-10-CM | POA: Diagnosis not present

## 2019-05-12 DIAGNOSIS — I2510 Atherosclerotic heart disease of native coronary artery without angina pectoris: Secondary | ICD-10-CM | POA: Diagnosis not present

## 2019-05-12 DIAGNOSIS — Z950 Presence of cardiac pacemaker: Secondary | ICD-10-CM | POA: Diagnosis not present

## 2019-05-12 DIAGNOSIS — I480 Paroxysmal atrial fibrillation: Secondary | ICD-10-CM | POA: Diagnosis not present

## 2019-05-12 DIAGNOSIS — I209 Angina pectoris, unspecified: Secondary | ICD-10-CM | POA: Diagnosis not present

## 2019-06-04 DIAGNOSIS — Z6822 Body mass index (BMI) 22.0-22.9, adult: Secondary | ICD-10-CM | POA: Diagnosis not present

## 2019-06-04 DIAGNOSIS — L299 Pruritus, unspecified: Secondary | ICD-10-CM | POA: Diagnosis not present

## 2019-06-13 ENCOUNTER — Encounter: Payer: Self-pay | Admitting: Gastroenterology

## 2019-06-13 ENCOUNTER — Ambulatory Visit (INDEPENDENT_AMBULATORY_CARE_PROVIDER_SITE_OTHER): Payer: Medicare Other | Admitting: Gastroenterology

## 2019-06-13 VITALS — BP 128/76 | HR 63 | Temp 97.1°F | Ht 67.0 in | Wt 152.1 lb

## 2019-06-13 DIAGNOSIS — K529 Noninfective gastroenteritis and colitis, unspecified: Secondary | ICD-10-CM | POA: Diagnosis not present

## 2019-06-13 DIAGNOSIS — K449 Diaphragmatic hernia without obstruction or gangrene: Secondary | ICD-10-CM | POA: Diagnosis not present

## 2019-06-13 DIAGNOSIS — R110 Nausea: Secondary | ICD-10-CM | POA: Diagnosis not present

## 2019-06-13 MED ORDER — ONDANSETRON 4 MG PO TBDP: 4.0000 mg | ORAL_TABLET | Freq: Four times a day (QID) | ORAL | 2 refills | Status: AC | PRN

## 2019-06-13 NOTE — Patient Instructions (Signed)
You [facility] [place] [address] [address]   You are scheduled on        at        You should arrive 15 minutes prior to your appointment time for registration. Please follow the written instructions below on the day of your exam:  WARNING: IF YOU ARE ALLERGIC TO IODINE/X-RAY DYE, PLEASE NOTIFY RADIOLOGY IMMEDIATELY AT [phone]! YOU WILL BE GIVEN A 13 HOUR PREMEDICATION PREP.  1) Do not eat or drink anything after        (4 hours prior to your test) 2) You have been given 2 bottles of oral contrast to drink. The solution may taste better if refrigerated, but do NOT add ice or any other liquid to this solution. Shake well before drinking.    Drink 1 bottle of contrast @         (2 hours prior to your exam)  Drink 1 bottle of contrast @        (1 hour prior to your exam)  You may take any medications as prescribed with a small amount of water, if necessary. If you take any of the following medications: METFORMIN, GLUCOPHAGE, GLUCOVANCE, AVANDAMET, RIOMET, FORTAMET, Springer MET, JANUMET, GLUMETZA or METAGLIP, you MAY be asked to HOLD this medication 48 hours AFTER the exam.  The purpose of you drinking the oral contrast is to aid in the visualization of your intestinal tract. The contrast solution may cause some diarrhea. Depending on your individual set of symptoms, you may also receive an intravenous injection of x-ray contrast/dye. Plan on being at [facility] for 30 minutes or longer, depending on the type of exam you are having performed.  This test typically takes 30-45 minutes to complete.  If you have any questions regarding your exam or if you need to reschedule, you may call the CT department at [phone] between the hours of 8:00 am and 5:00 pm, Monday-Friday.  We have sent the following medications to your pharmacy for you to pick up at your  convenience:   ________________________________________________________________________

## 2019-06-13 NOTE — Progress Notes (Signed)
Chief Complaint: FU  Referring Provider:  Mateo Flow, MD      ASSESSMENT AND PLAN;   #1. GERD with paraesophageal hernia s/p Nissen's fundoplication 03/3293.  #2. H/O Camplobacter colitis (treated with azithromycin) Adm to [facility] 7/25-7/27/2020. CT AP acute colitis, small HH.  #3. Nausea with occ vomiting, epi pain  #4.  Comorbid conditions include CAD s/p CABG, carotid artery dz, HTN, R sided CHF (EF 55-60%, mod PAH, TR 07/2018), COPD, CKD3, DM2, OA, pacemaker.  #5.  Plan: - Protonix 40mg  po bid to continue for now.  - Zofran 4mg  po Q6hrs prn #30, 2 refills - Has appt with Dr Otho Perl (cardiology) in sept 2021 - CT AP with PO contrast, No IV contrast. - If still with problems, UGI series - FU in end-sept 2021 after cardiology appointment. If still with problems, procced with EGD/colon after holding plavix 5 days and after cardiology clearance.  If still with problems, would proceed with solid-phase GES.   HPI:    Johnny Santiago is a 74 y.o. male  With CAD (recent NSTEMI 02/2019) s/p CABG on ASA/plavix, DM2, HTN, SSS s/p pacemeker, CKD3 For FU Recent admission to [facility] February 2021 with NSTEMI, with medical management.  Ranexa was increased, metoprolol was increased. He was seen by Dr. Otho Perl for follow-up.  Has been doing well from cardiology standpoint.  Not a candidate for any endoscopic procedures due to recent MI.  Cannot be taken off aspirin Plavix at the present time.   With intermittent N/V and epigastric discomfort.  No melena or hematochezia.  He does take Pepto-Bismol on as needed basis which helps to settle his stomach.  No fever chills or night sweats.  No jaundice dark urine or pale stools.  Denies having any constipation as long as he takes MiraLAX every day.  He will occasionally have diarrhea.  Has lost 6 pounds over the last 10 months.  Does admit he has not been eating well.   Wt Readings from Last 3 Encounters:  06/13/19 152 lb 2 oz (69 kg)  08/23/18 158 lb  (71.7 kg)      From previous notes: -Adm with acute Campylobacter gastroenteritis confirmed on noncontrast CT 08/03/2018 Treated with IV Zosyn followed by azithromycin p.o. with good results.   Past GI procedures: -Colonoscopy 07/22/2015-pancolonic diverticulosis, internal hemorrhoids.  Limited due to quality of preparation. -EGD 08/12/2014-hiatal hernia with paraesophageal component (before Nissen) -Upper GI series 03/07/2017: Reflux, nonspecific esophageal dysmotility, status post Nissen's fundoplication  Labs from 1/88/4166 showed hemoglobin 12.6, MCV 86, platelets 167.  BUN/creatinine 39/1.94  Past Medical History:  Diagnosis Date  . CAD (coronary artery disease)   . CKD (chronic kidney disease)   . COPD (chronic obstructive pulmonary disease) ([place])   . GERD (gastroesophageal reflux disease)   . Gout   . Heart disease   . Hyperlipidemia   . Hypertension   . Renal insufficiency   . Sleep apnea   . Type 2 diabetes mellitus ([place])     Past Surgical History:  Procedure Laterality Date  . CATARACT EXTRACTION    . COLONOSCOPY  12/24/2003   Mild sigmoid diverticulosis. Internal hemorrhoids. Otherwise normal colonoscopy to cecum.   . CORONARY ARTERY BYPASS GRAFT     x4  . CORONARY STENT PLACEMENT    . ESOPHAGOGASTRODUODENOSCOPY  09/08/2014   Large hiatal hernia eith paraesophageal component. Patient high risk volvulus. Mild gastritis.   Marland Kitchen PACEMAKER IMPLANT    . PARAESOPHAGEAL HERNIA REPAIR    .  SHOULDER SURGERY    . Surgery for Sleep Apnea     Tonsillectomy and septoplast and treacheostomy    Family History  Problem Relation Age of Onset  . Colon cancer Neg Hx   . Esophageal cancer Neg Hx     Social History   Tobacco Use  . Smoking status: Former Research scientist (life sciences)  . Smokeless tobacco: Never Used  Substance Use Topics  . Alcohol use: Not Currently  . Drug use: Never    Current Outpatient Medications  Medication Sig Dispense Refill  . aspirin EC 81 MG tablet Take 81 mg by  mouth daily.    Marland Kitchen atorvastatin (LIPITOR) 40 MG tablet 1 tablet daily.    . clopidogrel (PLAVIX) 75 MG tablet 1 tablet daily.    Marland Kitchen dicyclomine (BENTYL) 10 MG capsule Take 1 capsule (10 mg total) by mouth 2 (two) times daily at 8 am and 10 pm. (Patient taking differently: Take 10 mg by mouth daily. ) 60 capsule 4  . Ferrous Sulfate (IRON SUPPLEMENT PO) Take 4.5 mg by mouth 2 (two) times daily.    . furosemide (LASIX) 80 MG tablet Take 80 mg by mouth daily.     . insulin regular human CONCENTRATED (HUMULIN R U-500 KWIKPEN) 500 UNIT/ML kwikpen Inject into the skin 3 (three) times daily. On a sliding scale    . isosorbide mononitrate (IMDUR) 30 MG 24 hr tablet Take 30 mg by mouth daily.    . metoprolol tartrate (LOPRESSOR) 50 MG tablet Take 50 mg by mouth 2 (two) times daily.    . nitroGLYCERIN (NITROSTAT) 0.4 MG SL tablet Place 0.4 mg under the tongue as needed.    . pantoprazole (PROTONIX) 40 MG tablet 1 tablet 2 (two) times daily.    . tamsulosin (FLOMAX) 0.4 MG CAPS capsule 1 capsule 2 (two) times daily.     Marland Kitchen triamcinolone ointment (KENALOG) 0.1 % as needed.     No current facility-administered medications for this visit.    Not on File  Review of Systems:  neg     Physical Exam:    BP 128/76   Pulse 63   Temp (!) 97.1 F (36.2 C)   Ht 5\' 7"  (1.702 m)   Wt 152 lb 2 oz (69 kg)   BMI 23.83 kg/m  Filed Weights   06/13/19 1359  Weight: 152 lb 2 oz (69 kg)   Constitutional:  Well-developed, in no acute distress. Psychiatric: Normal mood and affect. Behavior is normal. HEENT: Pupils normal.  Conjunctivae are normal. No scleral icterus. Neck supple.  Cardiovascular: Normal rate, regular rhythm. No edema Pulmonary/chest: Effort normal and breath sounds normal. No wheezing, rales or rhonchi. Abdominal: Soft, nondistended. Nontender. Bowel sounds active throughout. There are no masses palpable. No hepatomegaly. Rectal:  defered Neurological: Alert and oriented to person place and  time. Skin: Skin is warm and dry. No rashes noted.  Data Reviewed: I have personally reviewed following labs and imaging studies  Extensive notes were reviewed.   Carmell Austria, MD 06/13/2019, 2:38 PM  Cc: Mateo Flow, MD

## 2019-06-19 ENCOUNTER — Ambulatory Visit (HOSPITAL_BASED_OUTPATIENT_CLINIC_OR_DEPARTMENT_OTHER)
Admission: RE | Admit: 2019-06-19 | Discharge: 2019-06-19 | Disposition: A | Discharge status: Home / Self Care | Payer: Medicare Other | Source: Ambulatory Visit | Attending: Gastroenterology | Admitting: Gastroenterology

## 2019-06-19 ENCOUNTER — Other Ambulatory Visit: Payer: Self-pay

## 2019-06-19 DIAGNOSIS — K449 Diaphragmatic hernia without obstruction or gangrene: Secondary | ICD-10-CM | POA: Diagnosis not present

## 2019-06-19 DIAGNOSIS — R1013 Epigastric pain: Secondary | ICD-10-CM | POA: Diagnosis not present

## 2019-06-19 DIAGNOSIS — R110 Nausea: Secondary | ICD-10-CM | POA: Diagnosis not present

## 2019-06-19 DIAGNOSIS — K529 Noninfective gastroenteritis and colitis, unspecified: Secondary | ICD-10-CM | POA: Diagnosis not present

## 2019-07-08 DIAGNOSIS — I214 Non-ST elevation (NSTEMI) myocardial infarction: Secondary | ICD-10-CM | POA: Diagnosis not present

## 2019-07-09 ENCOUNTER — Ambulatory Visit: Payer: Self-pay | Admitting: Allergy and Immunology

## 2019-07-10 DIAGNOSIS — I480 Paroxysmal atrial fibrillation: Secondary | ICD-10-CM | POA: Diagnosis not present

## 2019-07-10 DIAGNOSIS — I214 Non-ST elevation (NSTEMI) myocardial infarction: Secondary | ICD-10-CM | POA: Diagnosis not present

## 2019-07-10 DIAGNOSIS — Z955 Presence of coronary angioplasty implant and graft: Secondary | ICD-10-CM | POA: Diagnosis not present

## 2019-07-10 DIAGNOSIS — I2510 Atherosclerotic heart disease of native coronary artery without angina pectoris: Secondary | ICD-10-CM | POA: Diagnosis not present

## 2019-07-10 DIAGNOSIS — Z950 Presence of cardiac pacemaker: Secondary | ICD-10-CM | POA: Diagnosis not present

## 2019-07-10 DIAGNOSIS — Z951 Presence of aortocoronary bypass graft: Secondary | ICD-10-CM | POA: Diagnosis not present

## 2019-07-10 DIAGNOSIS — I959 Hypotension, unspecified: Secondary | ICD-10-CM | POA: Diagnosis not present

## 2019-07-11 DIAGNOSIS — I214 Non-ST elevation (NSTEMI) myocardial infarction: Secondary | ICD-10-CM | POA: Diagnosis not present

## 2019-07-11 DIAGNOSIS — I2510 Atherosclerotic heart disease of native coronary artery without angina pectoris: Secondary | ICD-10-CM | POA: Diagnosis not present

## 2019-07-11 DIAGNOSIS — I480 Paroxysmal atrial fibrillation: Secondary | ICD-10-CM | POA: Diagnosis not present

## 2019-07-11 DIAGNOSIS — Z950 Presence of cardiac pacemaker: Secondary | ICD-10-CM | POA: Diagnosis not present

## 2019-07-11 DIAGNOSIS — Z951 Presence of aortocoronary bypass graft: Secondary | ICD-10-CM | POA: Diagnosis not present

## 2019-07-11 DIAGNOSIS — Z955 Presence of coronary angioplasty implant and graft: Secondary | ICD-10-CM | POA: Diagnosis not present

## 2019-07-27 ENCOUNTER — Other Ambulatory Visit: Payer: Self-pay | Admitting: Gastroenterology

## 2019-07-27 DIAGNOSIS — A045 Campylobacter enteritis: Secondary | ICD-10-CM

## 2019-08-07 DIAGNOSIS — Z961 Presence of intraocular lens: Secondary | ICD-10-CM | POA: Diagnosis not present

## 2019-08-07 DIAGNOSIS — E113299 Type 2 diabetes mellitus with mild nonproliferative diabetic retinopathy without macular edema, unspecified eye: Secondary | ICD-10-CM | POA: Diagnosis not present

## 2019-08-07 DIAGNOSIS — E119 Type 2 diabetes mellitus without complications: Secondary | ICD-10-CM | POA: Diagnosis not present

## 2019-08-18 DIAGNOSIS — I10 Essential (primary) hypertension: Secondary | ICD-10-CM | POA: Diagnosis not present

## 2019-08-18 DIAGNOSIS — I6523 Occlusion and stenosis of bilateral carotid arteries: Secondary | ICD-10-CM | POA: Diagnosis not present

## 2019-08-18 DIAGNOSIS — Z6823 Body mass index (BMI) 23.0-23.9, adult: Secondary | ICD-10-CM | POA: Diagnosis not present

## 2019-09-12 DIAGNOSIS — I10 Essential (primary) hypertension: Secondary | ICD-10-CM | POA: Diagnosis not present

## 2019-09-12 DIAGNOSIS — E1142 Type 2 diabetes mellitus with diabetic polyneuropathy: Secondary | ICD-10-CM | POA: Diagnosis not present

## 2019-09-12 DIAGNOSIS — Z794 Long term (current) use of insulin: Secondary | ICD-10-CM | POA: Diagnosis not present

## 2019-09-12 DIAGNOSIS — E782 Mixed hyperlipidemia: Secondary | ICD-10-CM | POA: Diagnosis not present

## 2019-09-22 DIAGNOSIS — I495 Sick sinus syndrome: Secondary | ICD-10-CM | POA: Diagnosis not present

## 2019-09-22 DIAGNOSIS — I480 Paroxysmal atrial fibrillation: Secondary | ICD-10-CM | POA: Diagnosis not present

## 2019-09-22 DIAGNOSIS — Z955 Presence of coronary angioplasty implant and graft: Secondary | ICD-10-CM | POA: Diagnosis not present

## 2019-09-22 DIAGNOSIS — I252 Old myocardial infarction: Secondary | ICD-10-CM | POA: Diagnosis not present

## 2019-09-22 DIAGNOSIS — I209 Angina pectoris, unspecified: Secondary | ICD-10-CM | POA: Diagnosis not present

## 2019-09-22 DIAGNOSIS — Z950 Presence of cardiac pacemaker: Secondary | ICD-10-CM | POA: Diagnosis not present

## 2019-09-22 DIAGNOSIS — Z951 Presence of aortocoronary bypass graft: Secondary | ICD-10-CM | POA: Diagnosis not present

## 2019-09-22 DIAGNOSIS — I2510 Atherosclerotic heart disease of native coronary artery without angina pectoris: Secondary | ICD-10-CM | POA: Diagnosis not present

## 2019-09-22 DIAGNOSIS — I10 Essential (primary) hypertension: Secondary | ICD-10-CM | POA: Diagnosis not present

## 2019-09-23 DIAGNOSIS — I6523 Occlusion and stenosis of bilateral carotid arteries: Secondary | ICD-10-CM | POA: Diagnosis not present

## 2019-09-23 DIAGNOSIS — Z951 Presence of aortocoronary bypass graft: Secondary | ICD-10-CM | POA: Diagnosis not present

## 2019-09-23 DIAGNOSIS — Z7902 Long term (current) use of antithrombotics/antiplatelets: Secondary | ICD-10-CM | POA: Diagnosis not present

## 2019-09-23 DIAGNOSIS — E1122 Type 2 diabetes mellitus with diabetic chronic kidney disease: Secondary | ICD-10-CM | POA: Diagnosis not present

## 2019-09-23 DIAGNOSIS — N1832 Chronic kidney disease, stage 3b: Secondary | ICD-10-CM | POA: Diagnosis not present

## 2019-09-23 DIAGNOSIS — I2510 Atherosclerotic heart disease of native coronary artery without angina pectoris: Secondary | ICD-10-CM | POA: Diagnosis not present

## 2019-09-23 DIAGNOSIS — I129 Hypertensive chronic kidney disease with stage 1 through stage 4 chronic kidney disease, or unspecified chronic kidney disease: Secondary | ICD-10-CM | POA: Diagnosis not present

## 2019-09-23 DIAGNOSIS — Z950 Presence of cardiac pacemaker: Secondary | ICD-10-CM | POA: Diagnosis not present

## 2019-09-23 DIAGNOSIS — Z794 Long term (current) use of insulin: Secondary | ICD-10-CM | POA: Diagnosis not present

## 2019-09-23 DIAGNOSIS — E785 Hyperlipidemia, unspecified: Secondary | ICD-10-CM | POA: Diagnosis not present

## 2019-09-23 DIAGNOSIS — Z87891 Personal history of nicotine dependence: Secondary | ICD-10-CM | POA: Diagnosis not present

## 2019-09-23 DIAGNOSIS — Z7982 Long term (current) use of aspirin: Secondary | ICD-10-CM | POA: Diagnosis not present

## 2019-09-25 DIAGNOSIS — N189 Chronic kidney disease, unspecified: Secondary | ICD-10-CM | POA: Diagnosis not present

## 2019-09-25 DIAGNOSIS — I5032 Chronic diastolic (congestive) heart failure: Secondary | ICD-10-CM | POA: Diagnosis not present

## 2019-09-25 DIAGNOSIS — N183 Chronic kidney disease, stage 3 unspecified: Secondary | ICD-10-CM | POA: Diagnosis present

## 2019-09-25 DIAGNOSIS — R0902 Hypoxemia: Secondary | ICD-10-CM | POA: Diagnosis not present

## 2019-09-25 DIAGNOSIS — Z7982 Long term (current) use of aspirin: Secondary | ICD-10-CM | POA: Diagnosis not present

## 2019-09-25 DIAGNOSIS — Z950 Presence of cardiac pacemaker: Secondary | ICD-10-CM | POA: Diagnosis not present

## 2019-09-25 DIAGNOSIS — K589 Irritable bowel syndrome without diarrhea: Secondary | ICD-10-CM | POA: Diagnosis present

## 2019-09-25 DIAGNOSIS — I361 Nonrheumatic tricuspid (valve) insufficiency: Secondary | ICD-10-CM | POA: Diagnosis not present

## 2019-09-25 DIAGNOSIS — I130 Hypertensive heart and chronic kidney disease with heart failure and stage 1 through stage 4 chronic kidney disease, or unspecified chronic kidney disease: Secondary | ICD-10-CM | POA: Diagnosis present

## 2019-09-25 DIAGNOSIS — R251 Tremor, unspecified: Secondary | ICD-10-CM | POA: Diagnosis not present

## 2019-09-25 DIAGNOSIS — I252 Old myocardial infarction: Secondary | ICD-10-CM | POA: Diagnosis not present

## 2019-09-25 DIAGNOSIS — R471 Dysarthria and anarthria: Secondary | ICD-10-CM | POA: Diagnosis present

## 2019-09-25 DIAGNOSIS — E1122 Type 2 diabetes mellitus with diabetic chronic kidney disease: Secondary | ICD-10-CM | POA: Diagnosis present

## 2019-09-25 DIAGNOSIS — I340 Nonrheumatic mitral (valve) insufficiency: Secondary | ICD-10-CM | POA: Diagnosis not present

## 2019-09-25 DIAGNOSIS — I495 Sick sinus syndrome: Secondary | ICD-10-CM | POA: Diagnosis present

## 2019-09-25 DIAGNOSIS — I129 Hypertensive chronic kidney disease with stage 1 through stage 4 chronic kidney disease, or unspecified chronic kidney disease: Secondary | ICD-10-CM | POA: Diagnosis not present

## 2019-09-25 DIAGNOSIS — R531 Weakness: Secondary | ICD-10-CM | POA: Diagnosis not present

## 2019-09-25 DIAGNOSIS — I2510 Atherosclerotic heart disease of native coronary artery without angina pectoris: Secondary | ICD-10-CM | POA: Diagnosis not present

## 2019-09-25 DIAGNOSIS — Z87891 Personal history of nicotine dependence: Secondary | ICD-10-CM | POA: Diagnosis not present

## 2019-09-25 DIAGNOSIS — N400 Enlarged prostate without lower urinary tract symptoms: Secondary | ICD-10-CM | POA: Diagnosis present

## 2019-09-25 DIAGNOSIS — R29818 Other symptoms and signs involving the nervous system: Secondary | ICD-10-CM | POA: Diagnosis not present

## 2019-09-25 DIAGNOSIS — R4781 Slurred speech: Secondary | ICD-10-CM | POA: Diagnosis not present

## 2019-09-25 DIAGNOSIS — E1165 Type 2 diabetes mellitus with hyperglycemia: Secondary | ICD-10-CM | POA: Diagnosis present

## 2019-09-25 DIAGNOSIS — Z79899 Other long term (current) drug therapy: Secondary | ICD-10-CM | POA: Diagnosis not present

## 2019-09-25 DIAGNOSIS — N178 Other acute kidney failure: Secondary | ICD-10-CM | POA: Diagnosis present

## 2019-09-25 DIAGNOSIS — D508 Other iron deficiency anemias: Secondary | ICD-10-CM | POA: Diagnosis present

## 2019-09-25 DIAGNOSIS — Z794 Long term (current) use of insulin: Secondary | ICD-10-CM | POA: Diagnosis not present

## 2019-09-25 DIAGNOSIS — Z23 Encounter for immunization: Secondary | ICD-10-CM | POA: Diagnosis not present

## 2019-09-25 DIAGNOSIS — J449 Chronic obstructive pulmonary disease, unspecified: Secondary | ICD-10-CM | POA: Diagnosis present

## 2019-09-25 DIAGNOSIS — Z951 Presence of aortocoronary bypass graft: Secondary | ICD-10-CM | POA: Diagnosis not present

## 2019-09-25 DIAGNOSIS — N179 Acute kidney failure, unspecified: Secondary | ICD-10-CM | POA: Diagnosis not present

## 2019-09-25 DIAGNOSIS — K219 Gastro-esophageal reflux disease without esophagitis: Secondary | ICD-10-CM | POA: Diagnosis present

## 2019-09-25 DIAGNOSIS — M109 Gout, unspecified: Secondary | ICD-10-CM | POA: Diagnosis not present

## 2019-09-26 DIAGNOSIS — I340 Nonrheumatic mitral (valve) insufficiency: Secondary | ICD-10-CM | POA: Diagnosis not present

## 2019-09-29 DIAGNOSIS — Z87891 Personal history of nicotine dependence: Secondary | ICD-10-CM | POA: Diagnosis not present

## 2019-09-29 DIAGNOSIS — N1832 Chronic kidney disease, stage 3b: Secondary | ICD-10-CM | POA: Diagnosis not present

## 2019-09-29 DIAGNOSIS — I6523 Occlusion and stenosis of bilateral carotid arteries: Secondary | ICD-10-CM | POA: Diagnosis not present

## 2019-09-29 DIAGNOSIS — I6503 Occlusion and stenosis of bilateral vertebral arteries: Secondary | ICD-10-CM | POA: Diagnosis not present

## 2019-09-29 DIAGNOSIS — I672 Cerebral atherosclerosis: Secondary | ICD-10-CM | POA: Diagnosis not present

## 2019-09-29 DIAGNOSIS — I6389 Other cerebral infarction: Secondary | ICD-10-CM | POA: Diagnosis not present

## 2019-09-30 DIAGNOSIS — I6523 Occlusion and stenosis of bilateral carotid arteries: Secondary | ICD-10-CM | POA: Diagnosis not present

## 2019-10-03 DIAGNOSIS — Z6824 Body mass index (BMI) 24.0-24.9, adult: Secondary | ICD-10-CM | POA: Diagnosis not present

## 2019-10-03 DIAGNOSIS — I6523 Occlusion and stenosis of bilateral carotid arteries: Secondary | ICD-10-CM | POA: Diagnosis not present

## 2019-10-03 DIAGNOSIS — N183 Chronic kidney disease, stage 3 unspecified: Secondary | ICD-10-CM | POA: Diagnosis not present

## 2019-12-24 ENCOUNTER — Other Ambulatory Visit: Payer: Self-pay | Admitting: Gastroenterology

## 2019-12-24 DIAGNOSIS — A045 Campylobacter enteritis: Secondary | ICD-10-CM

## 2020-03-12 DIAGNOSIS — D485 Neoplasm of uncertain behavior of skin: Secondary | ICD-10-CM | POA: Diagnosis not present

## 2020-03-12 DIAGNOSIS — L280 Lichen simplex chronicus: Secondary | ICD-10-CM | POA: Diagnosis not present

## 2020-03-12 DIAGNOSIS — L821 Other seborrheic keratosis: Secondary | ICD-10-CM | POA: Diagnosis not present

## 2020-03-12 DIAGNOSIS — L570 Actinic keratosis: Secondary | ICD-10-CM | POA: Diagnosis not present

## 2020-03-16 DIAGNOSIS — L720 Epidermal cyst: Secondary | ICD-10-CM | POA: Diagnosis not present

## 2020-03-22 DIAGNOSIS — E1122 Type 2 diabetes mellitus with diabetic chronic kidney disease: Secondary | ICD-10-CM | POA: Diagnosis not present

## 2020-03-29 DIAGNOSIS — I10 Essential (primary) hypertension: Secondary | ICD-10-CM | POA: Diagnosis not present

## 2020-03-29 DIAGNOSIS — E1122 Type 2 diabetes mellitus with diabetic chronic kidney disease: Secondary | ICD-10-CM | POA: Diagnosis not present

## 2020-03-29 DIAGNOSIS — K219 Gastro-esophageal reflux disease without esophagitis: Secondary | ICD-10-CM | POA: Diagnosis not present

## 2020-03-29 DIAGNOSIS — I259 Chronic ischemic heart disease, unspecified: Secondary | ICD-10-CM | POA: Diagnosis not present

## 2020-03-29 DIAGNOSIS — Z0000 Encounter for general adult medical examination without abnormal findings: Secondary | ICD-10-CM | POA: Diagnosis not present

## 2020-06-14 DIAGNOSIS — Z951 Presence of aortocoronary bypass graft: Secondary | ICD-10-CM | POA: Diagnosis not present

## 2020-06-14 DIAGNOSIS — Z950 Presence of cardiac pacemaker: Secondary | ICD-10-CM | POA: Diagnosis not present

## 2020-06-14 DIAGNOSIS — I480 Paroxysmal atrial fibrillation: Secondary | ICD-10-CM | POA: Diagnosis not present

## 2020-06-14 DIAGNOSIS — I252 Old myocardial infarction: Secondary | ICD-10-CM | POA: Diagnosis not present

## 2020-06-14 DIAGNOSIS — I2510 Atherosclerotic heart disease of native coronary artery without angina pectoris: Secondary | ICD-10-CM | POA: Diagnosis not present

## 2020-06-14 DIAGNOSIS — Z955 Presence of coronary angioplasty implant and graft: Secondary | ICD-10-CM | POA: Diagnosis not present

## 2020-07-14 DIAGNOSIS — Z955 Presence of coronary angioplasty implant and graft: Secondary | ICD-10-CM | POA: Diagnosis not present

## 2020-07-14 DIAGNOSIS — Z794 Long term (current) use of insulin: Secondary | ICD-10-CM | POA: Diagnosis not present

## 2020-07-14 DIAGNOSIS — N1831 Chronic kidney disease, stage 3a: Secondary | ICD-10-CM | POA: Diagnosis not present

## 2020-07-14 DIAGNOSIS — N1832 Chronic kidney disease, stage 3b: Secondary | ICD-10-CM | POA: Diagnosis not present

## 2020-07-14 DIAGNOSIS — E785 Hyperlipidemia, unspecified: Secondary | ICD-10-CM | POA: Diagnosis not present

## 2020-07-14 DIAGNOSIS — I480 Paroxysmal atrial fibrillation: Secondary | ICD-10-CM | POA: Diagnosis not present

## 2020-07-14 DIAGNOSIS — Z951 Presence of aortocoronary bypass graft: Secondary | ICD-10-CM | POA: Diagnosis not present

## 2020-07-14 DIAGNOSIS — E1122 Type 2 diabetes mellitus with diabetic chronic kidney disease: Secondary | ICD-10-CM | POA: Diagnosis not present

## 2020-07-14 DIAGNOSIS — I252 Old myocardial infarction: Secondary | ICD-10-CM | POA: Diagnosis not present

## 2020-07-14 DIAGNOSIS — I129 Hypertensive chronic kidney disease with stage 1 through stage 4 chronic kidney disease, or unspecified chronic kidney disease: Secondary | ICD-10-CM | POA: Diagnosis not present

## 2020-07-14 DIAGNOSIS — I25118 Atherosclerotic heart disease of native coronary artery with other forms of angina pectoris: Secondary | ICD-10-CM | POA: Diagnosis not present

## 2020-07-14 DIAGNOSIS — Z950 Presence of cardiac pacemaker: Secondary | ICD-10-CM | POA: Diagnosis not present

## 2020-08-11 DIAGNOSIS — Z951 Presence of aortocoronary bypass graft: Secondary | ICD-10-CM | POA: Diagnosis not present

## 2020-08-11 DIAGNOSIS — Z955 Presence of coronary angioplasty implant and graft: Secondary | ICD-10-CM | POA: Diagnosis not present

## 2020-08-11 DIAGNOSIS — Z950 Presence of cardiac pacemaker: Secondary | ICD-10-CM | POA: Diagnosis not present

## 2020-08-11 DIAGNOSIS — I480 Paroxysmal atrial fibrillation: Secondary | ICD-10-CM | POA: Diagnosis not present

## 2020-08-11 DIAGNOSIS — I252 Old myocardial infarction: Secondary | ICD-10-CM | POA: Diagnosis not present

## 2020-08-11 DIAGNOSIS — I495 Sick sinus syndrome: Secondary | ICD-10-CM | POA: Diagnosis not present

## 2020-08-11 DIAGNOSIS — I2510 Atherosclerotic heart disease of native coronary artery without angina pectoris: Secondary | ICD-10-CM | POA: Diagnosis not present

## 2020-08-11 DIAGNOSIS — I10 Essential (primary) hypertension: Secondary | ICD-10-CM | POA: Diagnosis not present

## 2020-08-11 DIAGNOSIS — E785 Hyperlipidemia, unspecified: Secondary | ICD-10-CM | POA: Diagnosis not present

## 2020-08-16 DIAGNOSIS — Z794 Long term (current) use of insulin: Secondary | ICD-10-CM | POA: Diagnosis not present

## 2020-08-16 DIAGNOSIS — E1122 Type 2 diabetes mellitus with diabetic chronic kidney disease: Secondary | ICD-10-CM | POA: Diagnosis not present

## 2020-08-16 DIAGNOSIS — I129 Hypertensive chronic kidney disease with stage 1 through stage 4 chronic kidney disease, or unspecified chronic kidney disease: Secondary | ICD-10-CM | POA: Diagnosis not present

## 2020-08-16 DIAGNOSIS — E1142 Type 2 diabetes mellitus with diabetic polyneuropathy: Secondary | ICD-10-CM | POA: Diagnosis not present

## 2020-08-16 DIAGNOSIS — E785 Hyperlipidemia, unspecified: Secondary | ICD-10-CM | POA: Diagnosis not present

## 2020-08-16 DIAGNOSIS — N1831 Chronic kidney disease, stage 3a: Secondary | ICD-10-CM | POA: Diagnosis not present

## 2020-08-19 DIAGNOSIS — Z950 Presence of cardiac pacemaker: Secondary | ICD-10-CM | POA: Diagnosis not present

## 2020-08-19 DIAGNOSIS — I480 Paroxysmal atrial fibrillation: Secondary | ICD-10-CM | POA: Diagnosis not present

## 2020-08-19 DIAGNOSIS — G4733 Obstructive sleep apnea (adult) (pediatric): Secondary | ICD-10-CM | POA: Diagnosis not present

## 2020-08-19 DIAGNOSIS — Z45018 Encounter for adjustment and management of other part of cardiac pacemaker: Secondary | ICD-10-CM | POA: Diagnosis not present

## 2020-08-19 DIAGNOSIS — R0600 Dyspnea, unspecified: Secondary | ICD-10-CM | POA: Diagnosis not present

## 2020-08-19 DIAGNOSIS — I252 Old myocardial infarction: Secondary | ICD-10-CM | POA: Diagnosis not present

## 2020-08-19 DIAGNOSIS — R0602 Shortness of breath: Secondary | ICD-10-CM | POA: Diagnosis not present

## 2020-09-10 DIAGNOSIS — R0602 Shortness of breath: Secondary | ICD-10-CM | POA: Diagnosis not present

## 2020-09-15 DIAGNOSIS — I2510 Atherosclerotic heart disease of native coronary artery without angina pectoris: Secondary | ICD-10-CM | POA: Diagnosis not present

## 2020-09-15 DIAGNOSIS — Z951 Presence of aortocoronary bypass graft: Secondary | ICD-10-CM | POA: Diagnosis not present

## 2020-09-15 DIAGNOSIS — I495 Sick sinus syndrome: Secondary | ICD-10-CM | POA: Diagnosis not present

## 2020-09-15 DIAGNOSIS — I952 Hypotension due to drugs: Secondary | ICD-10-CM | POA: Diagnosis not present

## 2020-09-15 DIAGNOSIS — I209 Angina pectoris, unspecified: Secondary | ICD-10-CM | POA: Diagnosis not present

## 2020-09-15 DIAGNOSIS — I252 Old myocardial infarction: Secondary | ICD-10-CM | POA: Diagnosis not present

## 2020-09-15 DIAGNOSIS — E785 Hyperlipidemia, unspecified: Secondary | ICD-10-CM | POA: Diagnosis not present

## 2020-09-15 DIAGNOSIS — I440 Atrioventricular block, first degree: Secondary | ICD-10-CM | POA: Diagnosis not present

## 2020-09-15 DIAGNOSIS — Z950 Presence of cardiac pacemaker: Secondary | ICD-10-CM | POA: Diagnosis not present

## 2020-09-15 DIAGNOSIS — I480 Paroxysmal atrial fibrillation: Secondary | ICD-10-CM | POA: Diagnosis not present

## 2020-09-15 DIAGNOSIS — I493 Ventricular premature depolarization: Secondary | ICD-10-CM | POA: Diagnosis not present

## 2020-09-15 DIAGNOSIS — Z955 Presence of coronary angioplasty implant and graft: Secondary | ICD-10-CM | POA: Diagnosis not present

## 2020-09-15 DIAGNOSIS — I10 Essential (primary) hypertension: Secondary | ICD-10-CM | POA: Diagnosis not present

## 2020-09-16 DIAGNOSIS — Z961 Presence of intraocular lens: Secondary | ICD-10-CM | POA: Diagnosis not present

## 2020-09-16 DIAGNOSIS — H16143 Punctate keratitis, bilateral: Secondary | ICD-10-CM | POA: Diagnosis not present

## 2020-09-16 DIAGNOSIS — E119 Type 2 diabetes mellitus without complications: Secondary | ICD-10-CM | POA: Diagnosis not present

## 2020-09-20 DIAGNOSIS — R7309 Other abnormal glucose: Secondary | ICD-10-CM | POA: Diagnosis not present

## 2020-09-20 DIAGNOSIS — R0600 Dyspnea, unspecified: Secondary | ICD-10-CM | POA: Diagnosis not present

## 2020-09-20 DIAGNOSIS — I209 Angina pectoris, unspecified: Secondary | ICD-10-CM | POA: Diagnosis not present

## 2020-09-21 DIAGNOSIS — Z950 Presence of cardiac pacemaker: Secondary | ICD-10-CM | POA: Diagnosis not present

## 2020-09-21 DIAGNOSIS — I10 Essential (primary) hypertension: Secondary | ICD-10-CM | POA: Diagnosis not present

## 2020-09-21 DIAGNOSIS — Z955 Presence of coronary angioplasty implant and graft: Secondary | ICD-10-CM | POA: Diagnosis not present

## 2020-09-21 DIAGNOSIS — I459 Conduction disorder, unspecified: Secondary | ICD-10-CM | POA: Diagnosis not present

## 2020-09-21 DIAGNOSIS — T82855A Stenosis of coronary artery stent, initial encounter: Secondary | ICD-10-CM | POA: Diagnosis not present

## 2020-09-21 DIAGNOSIS — I25118 Atherosclerotic heart disease of native coronary artery with other forms of angina pectoris: Secondary | ICD-10-CM | POA: Diagnosis not present

## 2020-09-21 DIAGNOSIS — I252 Old myocardial infarction: Secondary | ICD-10-CM | POA: Diagnosis not present

## 2020-09-21 DIAGNOSIS — I495 Sick sinus syndrome: Secondary | ICD-10-CM | POA: Diagnosis not present

## 2020-09-21 DIAGNOSIS — Z87891 Personal history of nicotine dependence: Secondary | ICD-10-CM | POA: Diagnosis not present

## 2020-09-21 DIAGNOSIS — I25119 Atherosclerotic heart disease of native coronary artery with unspecified angina pectoris: Secondary | ICD-10-CM | POA: Diagnosis not present

## 2020-09-21 DIAGNOSIS — Z951 Presence of aortocoronary bypass graft: Secondary | ICD-10-CM | POA: Diagnosis not present

## 2020-09-21 DIAGNOSIS — E785 Hyperlipidemia, unspecified: Secondary | ICD-10-CM | POA: Diagnosis not present

## 2020-09-21 DIAGNOSIS — I480 Paroxysmal atrial fibrillation: Secondary | ICD-10-CM | POA: Diagnosis not present

## 2020-09-21 DIAGNOSIS — I9589 Other hypotension: Secondary | ICD-10-CM | POA: Diagnosis not present

## 2020-09-22 DIAGNOSIS — I10 Essential (primary) hypertension: Secondary | ICD-10-CM | POA: Diagnosis not present

## 2020-09-22 DIAGNOSIS — Z951 Presence of aortocoronary bypass graft: Secondary | ICD-10-CM | POA: Diagnosis not present

## 2020-09-22 DIAGNOSIS — I25118 Atherosclerotic heart disease of native coronary artery with other forms of angina pectoris: Secondary | ICD-10-CM | POA: Diagnosis not present

## 2020-09-22 DIAGNOSIS — I25119 Atherosclerotic heart disease of native coronary artery with unspecified angina pectoris: Secondary | ICD-10-CM | POA: Diagnosis not present

## 2020-09-22 DIAGNOSIS — I480 Paroxysmal atrial fibrillation: Secondary | ICD-10-CM | POA: Diagnosis not present

## 2020-09-22 DIAGNOSIS — I252 Old myocardial infarction: Secondary | ICD-10-CM | POA: Diagnosis not present

## 2020-09-22 DIAGNOSIS — I459 Conduction disorder, unspecified: Secondary | ICD-10-CM | POA: Diagnosis not present

## 2020-09-22 DIAGNOSIS — Z955 Presence of coronary angioplasty implant and graft: Secondary | ICD-10-CM | POA: Diagnosis not present

## 2020-09-23 DIAGNOSIS — I25119 Atherosclerotic heart disease of native coronary artery with unspecified angina pectoris: Secondary | ICD-10-CM | POA: Diagnosis not present

## 2020-09-23 DIAGNOSIS — Z6823 Body mass index (BMI) 23.0-23.9, adult: Secondary | ICD-10-CM | POA: Diagnosis not present

## 2020-09-23 DIAGNOSIS — G4700 Insomnia, unspecified: Secondary | ICD-10-CM | POA: Diagnosis not present

## 2020-09-23 DIAGNOSIS — J849 Interstitial pulmonary disease, unspecified: Secondary | ICD-10-CM | POA: Diagnosis not present

## 2020-09-29 DIAGNOSIS — N141 Nephropathy induced by other drugs, medicaments and biological substances: Secondary | ICD-10-CM | POA: Diagnosis present

## 2020-09-29 DIAGNOSIS — I509 Heart failure, unspecified: Secondary | ICD-10-CM | POA: Diagnosis not present

## 2020-09-29 DIAGNOSIS — N179 Acute kidney failure, unspecified: Secondary | ICD-10-CM | POA: Diagnosis not present

## 2020-09-29 DIAGNOSIS — N289 Disorder of kidney and ureter, unspecified: Secondary | ICD-10-CM | POA: Diagnosis not present

## 2020-09-29 DIAGNOSIS — J90 Pleural effusion, not elsewhere classified: Secondary | ICD-10-CM | POA: Diagnosis not present

## 2020-09-29 DIAGNOSIS — R911 Solitary pulmonary nodule: Secondary | ICD-10-CM | POA: Diagnosis not present

## 2020-09-29 DIAGNOSIS — R001 Bradycardia, unspecified: Secondary | ICD-10-CM | POA: Diagnosis not present

## 2020-09-29 DIAGNOSIS — J479 Bronchiectasis, uncomplicated: Secondary | ICD-10-CM | POA: Diagnosis not present

## 2020-09-29 DIAGNOSIS — I209 Angina pectoris, unspecified: Secondary | ICD-10-CM | POA: Diagnosis not present

## 2020-09-29 DIAGNOSIS — I110 Hypertensive heart disease with heart failure: Secondary | ICD-10-CM | POA: Diagnosis not present

## 2020-09-29 DIAGNOSIS — E1122 Type 2 diabetes mellitus with diabetic chronic kidney disease: Secondary | ICD-10-CM | POA: Diagnosis not present

## 2020-09-29 DIAGNOSIS — Z950 Presence of cardiac pacemaker: Secondary | ICD-10-CM | POA: Diagnosis not present

## 2020-09-29 DIAGNOSIS — I959 Hypotension, unspecified: Secondary | ICD-10-CM | POA: Diagnosis not present

## 2020-09-29 DIAGNOSIS — I213 ST elevation (STEMI) myocardial infarction of unspecified site: Secondary | ICD-10-CM | POA: Diagnosis not present

## 2020-09-29 DIAGNOSIS — R1111 Vomiting without nausea: Secondary | ICD-10-CM | POA: Diagnosis not present

## 2020-09-29 DIAGNOSIS — Z87891 Personal history of nicotine dependence: Secondary | ICD-10-CM | POA: Diagnosis not present

## 2020-09-29 DIAGNOSIS — I2111 ST elevation (STEMI) myocardial infarction involving right coronary artery: Secondary | ICD-10-CM | POA: Diagnosis present

## 2020-09-29 DIAGNOSIS — I25110 Atherosclerotic heart disease of native coronary artery with unstable angina pectoris: Secondary | ICD-10-CM | POA: Diagnosis present

## 2020-09-29 DIAGNOSIS — T508X5A Adverse effect of diagnostic agents, initial encounter: Secondary | ICD-10-CM | POA: Diagnosis present

## 2020-09-29 DIAGNOSIS — I493 Ventricular premature depolarization: Secondary | ICD-10-CM | POA: Diagnosis not present

## 2020-09-29 DIAGNOSIS — Z7982 Long term (current) use of aspirin: Secondary | ICD-10-CM | POA: Diagnosis not present

## 2020-09-29 DIAGNOSIS — I499 Cardiac arrhythmia, unspecified: Secondary | ICD-10-CM | POA: Diagnosis not present

## 2020-09-29 DIAGNOSIS — R9431 Abnormal electrocardiogram [ECG] [EKG]: Secondary | ICD-10-CM | POA: Diagnosis not present

## 2020-09-29 DIAGNOSIS — K219 Gastro-esophageal reflux disease without esophagitis: Secondary | ICD-10-CM | POA: Diagnosis present

## 2020-09-29 DIAGNOSIS — R079 Chest pain, unspecified: Secondary | ICD-10-CM | POA: Diagnosis not present

## 2020-09-29 DIAGNOSIS — Z794 Long term (current) use of insulin: Secondary | ICD-10-CM | POA: Diagnosis not present

## 2020-09-29 DIAGNOSIS — Z951 Presence of aortocoronary bypass graft: Secondary | ICD-10-CM | POA: Diagnosis not present

## 2020-09-29 DIAGNOSIS — R0789 Other chest pain: Secondary | ICD-10-CM | POA: Diagnosis not present

## 2020-09-29 DIAGNOSIS — E7800 Pure hypercholesterolemia, unspecified: Secondary | ICD-10-CM | POA: Diagnosis present

## 2020-09-29 DIAGNOSIS — I272 Pulmonary hypertension, unspecified: Secondary | ICD-10-CM | POA: Diagnosis present

## 2020-09-29 DIAGNOSIS — I480 Paroxysmal atrial fibrillation: Secondary | ICD-10-CM | POA: Diagnosis not present

## 2020-09-29 DIAGNOSIS — E119 Type 2 diabetes mellitus without complications: Secondary | ICD-10-CM | POA: Diagnosis not present

## 2020-09-29 DIAGNOSIS — I700 Atherosclerosis of aorta: Secondary | ICD-10-CM | POA: Diagnosis not present

## 2020-09-29 DIAGNOSIS — I130 Hypertensive heart and chronic kidney disease with heart failure and stage 1 through stage 4 chronic kidney disease, or unspecified chronic kidney disease: Secondary | ICD-10-CM | POA: Diagnosis not present

## 2020-09-29 DIAGNOSIS — R918 Other nonspecific abnormal finding of lung field: Secondary | ICD-10-CM | POA: Diagnosis not present

## 2020-09-29 DIAGNOSIS — I071 Rheumatic tricuspid insufficiency: Secondary | ICD-10-CM | POA: Diagnosis not present

## 2020-09-29 DIAGNOSIS — R590 Localized enlarged lymph nodes: Secondary | ICD-10-CM | POA: Diagnosis not present

## 2020-09-29 DIAGNOSIS — T82867A Thrombosis of cardiac prosthetic devices, implants and grafts, initial encounter: Secondary | ICD-10-CM | POA: Diagnosis not present

## 2020-09-29 DIAGNOSIS — I5021 Acute systolic (congestive) heart failure: Secondary | ICD-10-CM | POA: Diagnosis not present

## 2020-09-29 DIAGNOSIS — I25119 Atherosclerotic heart disease of native coronary artery with unspecified angina pectoris: Secondary | ICD-10-CM | POA: Diagnosis not present

## 2020-09-29 DIAGNOSIS — I21A9 Other myocardial infarction type: Secondary | ICD-10-CM | POA: Diagnosis not present

## 2020-09-29 DIAGNOSIS — E785 Hyperlipidemia, unspecified: Secondary | ICD-10-CM | POA: Diagnosis not present

## 2020-09-29 DIAGNOSIS — N189 Chronic kidney disease, unspecified: Secondary | ICD-10-CM | POA: Diagnosis not present

## 2020-09-29 DIAGNOSIS — Z955 Presence of coronary angioplasty implant and graft: Secondary | ICD-10-CM | POA: Diagnosis not present

## 2020-09-29 DIAGNOSIS — N183 Chronic kidney disease, stage 3 unspecified: Secondary | ICD-10-CM | POA: Diagnosis present

## 2020-09-29 DIAGNOSIS — Z79899 Other long term (current) drug therapy: Secondary | ICD-10-CM | POA: Diagnosis not present

## 2020-09-29 DIAGNOSIS — R0602 Shortness of breath: Secondary | ICD-10-CM | POA: Diagnosis not present

## 2020-09-29 DIAGNOSIS — Z20822 Contact with and (suspected) exposure to covid-19: Secondary | ICD-10-CM | POA: Diagnosis not present

## 2020-09-29 DIAGNOSIS — Z7409 Other reduced mobility: Secondary | ICD-10-CM | POA: Diagnosis not present

## 2020-09-29 DIAGNOSIS — K529 Noninfective gastroenteritis and colitis, unspecified: Secondary | ICD-10-CM | POA: Diagnosis present

## 2020-10-10 DIAGNOSIS — Z794 Long term (current) use of insulin: Secondary | ICD-10-CM | POA: Diagnosis not present

## 2020-10-10 DIAGNOSIS — I10 Essential (primary) hypertension: Secondary | ICD-10-CM | POA: Diagnosis not present

## 2020-10-10 DIAGNOSIS — Z79899 Other long term (current) drug therapy: Secondary | ICD-10-CM | POA: Diagnosis not present

## 2020-10-10 DIAGNOSIS — R278 Other lack of coordination: Secondary | ICD-10-CM | POA: Diagnosis not present

## 2020-10-10 DIAGNOSIS — N183 Chronic kidney disease, stage 3 unspecified: Secondary | ICD-10-CM | POA: Diagnosis not present

## 2020-10-10 DIAGNOSIS — I213 ST elevation (STEMI) myocardial infarction of unspecified site: Secondary | ICD-10-CM | POA: Diagnosis not present

## 2020-10-10 DIAGNOSIS — Z951 Presence of aortocoronary bypass graft: Secondary | ICD-10-CM | POA: Diagnosis not present

## 2020-10-10 DIAGNOSIS — R5381 Other malaise: Secondary | ICD-10-CM | POA: Diagnosis not present

## 2020-10-10 DIAGNOSIS — I5043 Acute on chronic combined systolic (congestive) and diastolic (congestive) heart failure: Secondary | ICD-10-CM | POA: Diagnosis not present

## 2020-10-10 DIAGNOSIS — J849 Interstitial pulmonary disease, unspecified: Secondary | ICD-10-CM | POA: Diagnosis present

## 2020-10-10 DIAGNOSIS — Z20822 Contact with and (suspected) exposure to covid-19: Secondary | ICD-10-CM | POA: Diagnosis not present

## 2020-10-10 DIAGNOSIS — G4700 Insomnia, unspecified: Secondary | ICD-10-CM | POA: Diagnosis present

## 2020-10-10 DIAGNOSIS — R531 Weakness: Secondary | ICD-10-CM | POA: Diagnosis not present

## 2020-10-10 DIAGNOSIS — Z743 Need for continuous supervision: Secondary | ICD-10-CM | POA: Diagnosis not present

## 2020-10-10 DIAGNOSIS — I229 Subsequent ST elevation (STEMI) myocardial infarction of unspecified site: Secondary | ICD-10-CM | POA: Diagnosis not present

## 2020-10-10 DIAGNOSIS — E871 Hypo-osmolality and hyponatremia: Secondary | ICD-10-CM | POA: Diagnosis not present

## 2020-10-10 DIAGNOSIS — Z7902 Long term (current) use of antithrombotics/antiplatelets: Secondary | ICD-10-CM | POA: Diagnosis not present

## 2020-10-10 DIAGNOSIS — R410 Disorientation, unspecified: Secondary | ICD-10-CM | POA: Diagnosis not present

## 2020-10-10 DIAGNOSIS — R0789 Other chest pain: Secondary | ICD-10-CM | POA: Diagnosis not present

## 2020-10-10 DIAGNOSIS — Z7982 Long term (current) use of aspirin: Secondary | ICD-10-CM | POA: Diagnosis not present

## 2020-10-10 DIAGNOSIS — Z87891 Personal history of nicotine dependence: Secondary | ICD-10-CM | POA: Diagnosis not present

## 2020-10-10 DIAGNOSIS — N400 Enlarged prostate without lower urinary tract symptoms: Secondary | ICD-10-CM | POA: Diagnosis present

## 2020-10-10 DIAGNOSIS — Z950 Presence of cardiac pacemaker: Secondary | ICD-10-CM | POA: Diagnosis not present

## 2020-10-10 DIAGNOSIS — I5022 Chronic systolic (congestive) heart failure: Secondary | ICD-10-CM | POA: Diagnosis not present

## 2020-10-10 DIAGNOSIS — I495 Sick sinus syndrome: Secondary | ICD-10-CM | POA: Diagnosis present

## 2020-10-10 DIAGNOSIS — R2681 Unsteadiness on feet: Secondary | ICD-10-CM | POA: Diagnosis not present

## 2020-10-10 DIAGNOSIS — E1122 Type 2 diabetes mellitus with diabetic chronic kidney disease: Secondary | ICD-10-CM | POA: Diagnosis present

## 2020-10-10 DIAGNOSIS — N1831 Chronic kidney disease, stage 3a: Secondary | ICD-10-CM | POA: Diagnosis present

## 2020-10-10 DIAGNOSIS — D638 Anemia in other chronic diseases classified elsewhere: Secondary | ICD-10-CM | POA: Diagnosis not present

## 2020-10-10 DIAGNOSIS — R0600 Dyspnea, unspecified: Secondary | ICD-10-CM | POA: Diagnosis not present

## 2020-10-10 DIAGNOSIS — I959 Hypotension, unspecified: Secondary | ICD-10-CM | POA: Diagnosis not present

## 2020-10-10 DIAGNOSIS — E861 Hypovolemia: Secondary | ICD-10-CM | POA: Diagnosis present

## 2020-10-10 DIAGNOSIS — I25810 Atherosclerosis of coronary artery bypass graft(s) without angina pectoris: Secondary | ICD-10-CM | POA: Diagnosis not present

## 2020-10-10 DIAGNOSIS — J90 Pleural effusion, not elsewhere classified: Secondary | ICD-10-CM | POA: Diagnosis not present

## 2020-10-10 DIAGNOSIS — R630 Anorexia: Secondary | ICD-10-CM | POA: Diagnosis not present

## 2020-10-10 DIAGNOSIS — E1142 Type 2 diabetes mellitus with diabetic polyneuropathy: Secondary | ICD-10-CM | POA: Diagnosis present

## 2020-10-10 DIAGNOSIS — E785 Hyperlipidemia, unspecified: Secondary | ICD-10-CM | POA: Diagnosis present

## 2020-10-10 DIAGNOSIS — I2119 ST elevation (STEMI) myocardial infarction involving other coronary artery of inferior wall: Secondary | ICD-10-CM | POA: Diagnosis not present

## 2020-10-10 DIAGNOSIS — N179 Acute kidney failure, unspecified: Secondary | ICD-10-CM | POA: Diagnosis not present

## 2020-10-10 DIAGNOSIS — I2510 Atherosclerotic heart disease of native coronary artery without angina pectoris: Secondary | ICD-10-CM | POA: Diagnosis present

## 2020-10-10 DIAGNOSIS — R079 Chest pain, unspecified: Secondary | ICD-10-CM | POA: Diagnosis not present

## 2020-10-10 DIAGNOSIS — I25119 Atherosclerotic heart disease of native coronary artery with unspecified angina pectoris: Secondary | ICD-10-CM | POA: Diagnosis not present

## 2020-10-10 DIAGNOSIS — M6281 Muscle weakness (generalized): Secondary | ICD-10-CM | POA: Diagnosis not present

## 2020-10-10 DIAGNOSIS — K219 Gastro-esophageal reflux disease without esophagitis: Secondary | ICD-10-CM | POA: Diagnosis present

## 2020-10-10 DIAGNOSIS — I130 Hypertensive heart and chronic kidney disease with heart failure and stage 1 through stage 4 chronic kidney disease, or unspecified chronic kidney disease: Secondary | ICD-10-CM | POA: Diagnosis not present

## 2020-10-10 DIAGNOSIS — I517 Cardiomegaly: Secondary | ICD-10-CM | POA: Diagnosis not present

## 2020-10-10 DIAGNOSIS — E119 Type 2 diabetes mellitus without complications: Secondary | ICD-10-CM | POA: Diagnosis not present

## 2020-10-10 DIAGNOSIS — Z8673 Personal history of transient ischemic attack (TIA), and cerebral infarction without residual deficits: Secondary | ICD-10-CM | POA: Diagnosis not present

## 2020-10-10 DIAGNOSIS — I5020 Unspecified systolic (congestive) heart failure: Secondary | ICD-10-CM | POA: Diagnosis not present

## 2020-10-10 DIAGNOSIS — I480 Paroxysmal atrial fibrillation: Secondary | ICD-10-CM | POA: Diagnosis not present

## 2020-10-10 DIAGNOSIS — G4733 Obstructive sleep apnea (adult) (pediatric): Secondary | ICD-10-CM | POA: Diagnosis not present

## 2020-10-10 DIAGNOSIS — I252 Old myocardial infarction: Secondary | ICD-10-CM | POA: Diagnosis not present

## 2020-10-10 DIAGNOSIS — Z955 Presence of coronary angioplasty implant and graft: Secondary | ICD-10-CM | POA: Diagnosis not present

## 2020-10-14 DIAGNOSIS — G4733 Obstructive sleep apnea (adult) (pediatric): Secondary | ICD-10-CM | POA: Diagnosis not present

## 2020-10-14 DIAGNOSIS — Z951 Presence of aortocoronary bypass graft: Secondary | ICD-10-CM | POA: Diagnosis not present

## 2020-10-14 DIAGNOSIS — R112 Nausea with vomiting, unspecified: Secondary | ICD-10-CM | POA: Diagnosis not present

## 2020-10-14 DIAGNOSIS — R630 Anorexia: Secondary | ICD-10-CM | POA: Diagnosis not present

## 2020-10-14 DIAGNOSIS — R2681 Unsteadiness on feet: Secondary | ICD-10-CM | POA: Diagnosis not present

## 2020-10-14 DIAGNOSIS — R52 Pain, unspecified: Secondary | ICD-10-CM | POA: Diagnosis not present

## 2020-10-14 DIAGNOSIS — J90 Pleural effusion, not elsewhere classified: Secondary | ICD-10-CM | POA: Diagnosis not present

## 2020-10-14 DIAGNOSIS — R0602 Shortness of breath: Secondary | ICD-10-CM | POA: Diagnosis not present

## 2020-10-14 DIAGNOSIS — R319 Hematuria, unspecified: Secondary | ICD-10-CM | POA: Diagnosis not present

## 2020-10-14 DIAGNOSIS — R1111 Vomiting without nausea: Secondary | ICD-10-CM | POA: Diagnosis not present

## 2020-10-14 DIAGNOSIS — M6281 Muscle weakness (generalized): Secondary | ICD-10-CM | POA: Diagnosis not present

## 2020-10-14 DIAGNOSIS — R5381 Other malaise: Secondary | ICD-10-CM | POA: Diagnosis not present

## 2020-10-14 DIAGNOSIS — G4700 Insomnia, unspecified: Secondary | ICD-10-CM | POA: Diagnosis not present

## 2020-10-14 DIAGNOSIS — I129 Hypertensive chronic kidney disease with stage 1 through stage 4 chronic kidney disease, or unspecified chronic kidney disease: Secondary | ICD-10-CM | POA: Diagnosis not present

## 2020-10-14 DIAGNOSIS — I252 Old myocardial infarction: Secondary | ICD-10-CM | POA: Diagnosis not present

## 2020-10-14 DIAGNOSIS — Z950 Presence of cardiac pacemaker: Secondary | ICD-10-CM | POA: Diagnosis not present

## 2020-10-14 DIAGNOSIS — N400 Enlarged prostate without lower urinary tract symptoms: Secondary | ICD-10-CM | POA: Diagnosis not present

## 2020-10-14 DIAGNOSIS — K449 Diaphragmatic hernia without obstruction or gangrene: Secondary | ICD-10-CM | POA: Diagnosis not present

## 2020-10-14 DIAGNOSIS — R0902 Hypoxemia: Secondary | ICD-10-CM | POA: Diagnosis not present

## 2020-10-14 DIAGNOSIS — E785 Hyperlipidemia, unspecified: Secondary | ICD-10-CM | POA: Diagnosis not present

## 2020-10-14 DIAGNOSIS — I480 Paroxysmal atrial fibrillation: Secondary | ICD-10-CM | POA: Diagnosis not present

## 2020-10-14 DIAGNOSIS — E119 Type 2 diabetes mellitus without complications: Secondary | ICD-10-CM | POA: Diagnosis not present

## 2020-10-14 DIAGNOSIS — N179 Acute kidney failure, unspecified: Secondary | ICD-10-CM | POA: Diagnosis not present

## 2020-10-14 DIAGNOSIS — R278 Other lack of coordination: Secondary | ICD-10-CM | POA: Diagnosis not present

## 2020-10-14 DIAGNOSIS — I10 Essential (primary) hypertension: Secondary | ICD-10-CM | POA: Diagnosis not present

## 2020-10-14 DIAGNOSIS — J80 Acute respiratory distress syndrome: Secondary | ICD-10-CM | POA: Diagnosis not present

## 2020-10-14 DIAGNOSIS — R410 Disorientation, unspecified: Secondary | ICD-10-CM | POA: Diagnosis not present

## 2020-10-14 DIAGNOSIS — K219 Gastro-esophageal reflux disease without esophagitis: Secondary | ICD-10-CM | POA: Diagnosis not present

## 2020-10-14 DIAGNOSIS — R069 Unspecified abnormalities of breathing: Secondary | ICD-10-CM | POA: Diagnosis not present

## 2020-10-14 DIAGNOSIS — D638 Anemia in other chronic diseases classified elsewhere: Secondary | ICD-10-CM | POA: Diagnosis not present

## 2020-10-14 DIAGNOSIS — I5043 Acute on chronic combined systolic (congestive) and diastolic (congestive) heart failure: Secondary | ICD-10-CM | POA: Diagnosis not present

## 2020-10-14 DIAGNOSIS — I5020 Unspecified systolic (congestive) heart failure: Secondary | ICD-10-CM | POA: Diagnosis not present

## 2020-10-14 DIAGNOSIS — I2510 Atherosclerotic heart disease of native coronary artery without angina pectoris: Secondary | ICD-10-CM | POA: Diagnosis not present

## 2020-10-14 DIAGNOSIS — J849 Interstitial pulmonary disease, unspecified: Secondary | ICD-10-CM | POA: Diagnosis not present

## 2020-10-14 DIAGNOSIS — R079 Chest pain, unspecified: Secondary | ICD-10-CM | POA: Diagnosis not present

## 2020-10-14 DIAGNOSIS — N183 Chronic kidney disease, stage 3 unspecified: Secondary | ICD-10-CM | POA: Diagnosis not present

## 2020-10-14 DIAGNOSIS — R0689 Other abnormalities of breathing: Secondary | ICD-10-CM | POA: Diagnosis not present

## 2020-10-14 DIAGNOSIS — R58 Hemorrhage, not elsewhere classified: Secondary | ICD-10-CM | POA: Diagnosis not present

## 2020-10-14 DIAGNOSIS — I213 ST elevation (STEMI) myocardial infarction of unspecified site: Secondary | ICD-10-CM | POA: Diagnosis not present

## 2020-10-14 DIAGNOSIS — N189 Chronic kidney disease, unspecified: Secondary | ICD-10-CM | POA: Diagnosis not present

## 2020-10-14 DIAGNOSIS — I959 Hypotension, unspecified: Secondary | ICD-10-CM | POA: Diagnosis not present

## 2020-10-14 DIAGNOSIS — Z743 Need for continuous supervision: Secondary | ICD-10-CM | POA: Diagnosis not present

## 2020-10-15 DIAGNOSIS — I10 Essential (primary) hypertension: Secondary | ICD-10-CM | POA: Diagnosis not present

## 2020-10-15 DIAGNOSIS — K219 Gastro-esophageal reflux disease without esophagitis: Secondary | ICD-10-CM | POA: Diagnosis not present

## 2020-10-15 DIAGNOSIS — J849 Interstitial pulmonary disease, unspecified: Secondary | ICD-10-CM | POA: Diagnosis not present

## 2020-10-15 DIAGNOSIS — I2510 Atherosclerotic heart disease of native coronary artery without angina pectoris: Secondary | ICD-10-CM | POA: Diagnosis not present

## 2020-10-15 DIAGNOSIS — N400 Enlarged prostate without lower urinary tract symptoms: Secondary | ICD-10-CM | POA: Diagnosis not present

## 2020-10-15 DIAGNOSIS — R5381 Other malaise: Secondary | ICD-10-CM | POA: Diagnosis not present

## 2020-10-15 DIAGNOSIS — N183 Chronic kidney disease, stage 3 unspecified: Secondary | ICD-10-CM | POA: Diagnosis not present

## 2020-10-15 DIAGNOSIS — I5043 Acute on chronic combined systolic (congestive) and diastolic (congestive) heart failure: Secondary | ICD-10-CM | POA: Diagnosis not present

## 2020-10-18 DIAGNOSIS — R319 Hematuria, unspecified: Secondary | ICD-10-CM | POA: Diagnosis not present

## 2020-10-18 DIAGNOSIS — R1111 Vomiting without nausea: Secondary | ICD-10-CM | POA: Diagnosis not present

## 2020-10-19 DIAGNOSIS — I5043 Acute on chronic combined systolic (congestive) and diastolic (congestive) heart failure: Secondary | ICD-10-CM | POA: Diagnosis not present

## 2020-10-19 DIAGNOSIS — D638 Anemia in other chronic diseases classified elsewhere: Secondary | ICD-10-CM | POA: Diagnosis not present

## 2020-10-19 DIAGNOSIS — R112 Nausea with vomiting, unspecified: Secondary | ICD-10-CM | POA: Diagnosis not present

## 2020-10-19 DIAGNOSIS — R5381 Other malaise: Secondary | ICD-10-CM | POA: Diagnosis not present

## 2020-10-21 DIAGNOSIS — R0902 Hypoxemia: Secondary | ICD-10-CM | POA: Diagnosis not present

## 2020-10-21 DIAGNOSIS — K449 Diaphragmatic hernia without obstruction or gangrene: Secondary | ICD-10-CM | POA: Diagnosis not present

## 2020-10-21 DIAGNOSIS — I129 Hypertensive chronic kidney disease with stage 1 through stage 4 chronic kidney disease, or unspecified chronic kidney disease: Secondary | ICD-10-CM | POA: Diagnosis not present

## 2020-10-21 DIAGNOSIS — R0602 Shortness of breath: Secondary | ICD-10-CM | POA: Diagnosis not present

## 2020-10-21 DIAGNOSIS — J90 Pleural effusion, not elsewhere classified: Secondary | ICD-10-CM | POA: Diagnosis not present

## 2020-10-21 DIAGNOSIS — R0689 Other abnormalities of breathing: Secondary | ICD-10-CM | POA: Diagnosis not present

## 2020-10-21 DIAGNOSIS — R319 Hematuria, unspecified: Secondary | ICD-10-CM | POA: Diagnosis not present

## 2020-10-21 DIAGNOSIS — N189 Chronic kidney disease, unspecified: Secondary | ICD-10-CM | POA: Diagnosis not present

## 2020-10-22 DIAGNOSIS — Z9981 Dependence on supplemental oxygen: Secondary | ICD-10-CM | POA: Diagnosis not present

## 2020-10-22 DIAGNOSIS — Z743 Need for continuous supervision: Secondary | ICD-10-CM | POA: Diagnosis not present

## 2020-10-22 DIAGNOSIS — K219 Gastro-esophageal reflux disease without esophagitis: Secondary | ICD-10-CM | POA: Diagnosis not present

## 2020-10-22 DIAGNOSIS — Z20822 Contact with and (suspected) exposure to covid-19: Secondary | ICD-10-CM | POA: Diagnosis present

## 2020-10-22 DIAGNOSIS — J90 Pleural effusion, not elsewhere classified: Secondary | ICD-10-CM | POA: Diagnosis not present

## 2020-10-22 DIAGNOSIS — I509 Heart failure, unspecified: Secondary | ICD-10-CM | POA: Diagnosis not present

## 2020-10-22 DIAGNOSIS — I959 Hypotension, unspecified: Secondary | ICD-10-CM | POA: Diagnosis present

## 2020-10-22 DIAGNOSIS — R0602 Shortness of breath: Secondary | ICD-10-CM | POA: Diagnosis not present

## 2020-10-22 DIAGNOSIS — I5033 Acute on chronic diastolic (congestive) heart failure: Secondary | ICD-10-CM | POA: Diagnosis present

## 2020-10-22 DIAGNOSIS — Z7982 Long term (current) use of aspirin: Secondary | ICD-10-CM | POA: Diagnosis not present

## 2020-10-22 DIAGNOSIS — J99 Respiratory disorders in diseases classified elsewhere: Secondary | ICD-10-CM | POA: Diagnosis not present

## 2020-10-22 DIAGNOSIS — J440 Chronic obstructive pulmonary disease with acute lower respiratory infection: Secondary | ICD-10-CM | POA: Diagnosis present

## 2020-10-22 DIAGNOSIS — I255 Ischemic cardiomyopathy: Secondary | ICD-10-CM | POA: Diagnosis present

## 2020-10-22 DIAGNOSIS — N189 Chronic kidney disease, unspecified: Secondary | ICD-10-CM | POA: Diagnosis not present

## 2020-10-22 DIAGNOSIS — J209 Acute bronchitis, unspecified: Secondary | ICD-10-CM | POA: Diagnosis present

## 2020-10-22 DIAGNOSIS — Z7952 Long term (current) use of systemic steroids: Secondary | ICD-10-CM | POA: Diagnosis not present

## 2020-10-22 DIAGNOSIS — I252 Old myocardial infarction: Secondary | ICD-10-CM | POA: Diagnosis not present

## 2020-10-22 DIAGNOSIS — I130 Hypertensive heart and chronic kidney disease with heart failure and stage 1 through stage 4 chronic kidney disease, or unspecified chronic kidney disease: Secondary | ICD-10-CM | POA: Diagnosis not present

## 2020-10-22 DIAGNOSIS — E785 Hyperlipidemia, unspecified: Secondary | ICD-10-CM | POA: Diagnosis not present

## 2020-10-22 DIAGNOSIS — E1122 Type 2 diabetes mellitus with diabetic chronic kidney disease: Secondary | ICD-10-CM | POA: Diagnosis present

## 2020-10-22 DIAGNOSIS — M6259 Muscle wasting and atrophy, not elsewhere classified, multiple sites: Secondary | ICD-10-CM | POA: Diagnosis not present

## 2020-10-22 DIAGNOSIS — R262 Difficulty in walking, not elsewhere classified: Secondary | ICD-10-CM | POA: Diagnosis not present

## 2020-10-22 DIAGNOSIS — J42 Unspecified chronic bronchitis: Secondary | ICD-10-CM | POA: Diagnosis not present

## 2020-10-22 DIAGNOSIS — I340 Nonrheumatic mitral (valve) insufficiency: Secondary | ICD-10-CM | POA: Diagnosis present

## 2020-10-22 DIAGNOSIS — J9621 Acute and chronic respiratory failure with hypoxia: Secondary | ICD-10-CM | POA: Diagnosis not present

## 2020-10-22 DIAGNOSIS — R0689 Other abnormalities of breathing: Secondary | ICD-10-CM | POA: Diagnosis not present

## 2020-10-22 DIAGNOSIS — J441 Chronic obstructive pulmonary disease with (acute) exacerbation: Secondary | ICD-10-CM | POA: Diagnosis present

## 2020-10-22 DIAGNOSIS — N179 Acute kidney failure, unspecified: Secondary | ICD-10-CM | POA: Diagnosis present

## 2020-10-22 DIAGNOSIS — E1151 Type 2 diabetes mellitus with diabetic peripheral angiopathy without gangrene: Secondary | ICD-10-CM | POA: Diagnosis present

## 2020-10-22 DIAGNOSIS — Z792 Long term (current) use of antibiotics: Secondary | ICD-10-CM | POA: Diagnosis not present

## 2020-10-22 DIAGNOSIS — I25119 Atherosclerotic heart disease of native coronary artery with unspecified angina pectoris: Secondary | ICD-10-CM | POA: Diagnosis present

## 2020-10-22 DIAGNOSIS — I2510 Atherosclerotic heart disease of native coronary artery without angina pectoris: Secondary | ICD-10-CM | POA: Diagnosis not present

## 2020-10-22 DIAGNOSIS — I129 Hypertensive chronic kidney disease with stage 1 through stage 4 chronic kidney disease, or unspecified chronic kidney disease: Secondary | ICD-10-CM | POA: Diagnosis not present

## 2020-10-22 DIAGNOSIS — J8410 Pulmonary fibrosis, unspecified: Secondary | ICD-10-CM | POA: Diagnosis present

## 2020-10-22 DIAGNOSIS — R0902 Hypoxemia: Secondary | ICD-10-CM | POA: Diagnosis not present

## 2020-10-22 DIAGNOSIS — K5790 Diverticulosis of intestine, part unspecified, without perforation or abscess without bleeding: Secondary | ICD-10-CM | POA: Diagnosis not present

## 2020-10-22 DIAGNOSIS — J9601 Acute respiratory failure with hypoxia: Secondary | ICD-10-CM | POA: Diagnosis present

## 2020-10-22 DIAGNOSIS — I10 Essential (primary) hypertension: Secondary | ICD-10-CM | POA: Diagnosis not present

## 2020-10-22 DIAGNOSIS — R319 Hematuria, unspecified: Secondary | ICD-10-CM | POA: Diagnosis present

## 2020-10-22 DIAGNOSIS — E119 Type 2 diabetes mellitus without complications: Secondary | ICD-10-CM | POA: Diagnosis not present

## 2020-10-22 DIAGNOSIS — N1832 Chronic kidney disease, stage 3b: Secondary | ICD-10-CM | POA: Diagnosis present

## 2020-10-22 DIAGNOSIS — G4700 Insomnia, unspecified: Secondary | ICD-10-CM | POA: Diagnosis not present

## 2020-10-22 DIAGNOSIS — I214 Non-ST elevation (NSTEMI) myocardial infarction: Secondary | ICD-10-CM | POA: Diagnosis present

## 2020-10-22 DIAGNOSIS — E7800 Pure hypercholesterolemia, unspecified: Secondary | ICD-10-CM | POA: Diagnosis present

## 2020-10-22 DIAGNOSIS — D509 Iron deficiency anemia, unspecified: Secondary | ICD-10-CM | POA: Diagnosis present

## 2020-10-22 DIAGNOSIS — I517 Cardiomegaly: Secondary | ICD-10-CM | POA: Diagnosis not present

## 2020-10-22 DIAGNOSIS — Z955 Presence of coronary angioplasty implant and graft: Secondary | ICD-10-CM | POA: Diagnosis not present

## 2020-10-22 DIAGNOSIS — J849 Interstitial pulmonary disease, unspecified: Secondary | ICD-10-CM | POA: Diagnosis not present

## 2020-10-22 DIAGNOSIS — R531 Weakness: Secondary | ICD-10-CM | POA: Diagnosis not present

## 2020-10-22 DIAGNOSIS — K449 Diaphragmatic hernia without obstruction or gangrene: Secondary | ICD-10-CM | POA: Diagnosis not present

## 2020-10-22 DIAGNOSIS — M6281 Muscle weakness (generalized): Secondary | ICD-10-CM | POA: Diagnosis not present

## 2020-10-22 DIAGNOSIS — R0989 Other specified symptoms and signs involving the circulatory and respiratory systems: Secondary | ICD-10-CM | POA: Diagnosis not present

## 2020-10-23 DIAGNOSIS — I340 Nonrheumatic mitral (valve) insufficiency: Secondary | ICD-10-CM | POA: Diagnosis not present

## 2020-10-23 DIAGNOSIS — E119 Type 2 diabetes mellitus without complications: Secondary | ICD-10-CM

## 2020-10-23 DIAGNOSIS — N189 Chronic kidney disease, unspecified: Secondary | ICD-10-CM

## 2020-10-23 DIAGNOSIS — I2510 Atherosclerotic heart disease of native coronary artery without angina pectoris: Secondary | ICD-10-CM

## 2020-10-23 DIAGNOSIS — J9621 Acute and chronic respiratory failure with hypoxia: Secondary | ICD-10-CM

## 2020-10-23 DIAGNOSIS — I509 Heart failure, unspecified: Secondary | ICD-10-CM

## 2020-10-24 DIAGNOSIS — N189 Chronic kidney disease, unspecified: Secondary | ICD-10-CM | POA: Diagnosis not present

## 2020-10-24 DIAGNOSIS — E119 Type 2 diabetes mellitus without complications: Secondary | ICD-10-CM | POA: Diagnosis not present

## 2020-10-24 DIAGNOSIS — J9621 Acute and chronic respiratory failure with hypoxia: Secondary | ICD-10-CM | POA: Diagnosis not present

## 2020-10-24 DIAGNOSIS — I2510 Atherosclerotic heart disease of native coronary artery without angina pectoris: Secondary | ICD-10-CM | POA: Diagnosis not present

## 2020-10-24 DIAGNOSIS — R0989 Other specified symptoms and signs involving the circulatory and respiratory systems: Secondary | ICD-10-CM

## 2020-10-24 DIAGNOSIS — I340 Nonrheumatic mitral (valve) insufficiency: Secondary | ICD-10-CM

## 2020-10-24 DIAGNOSIS — I255 Ischemic cardiomyopathy: Secondary | ICD-10-CM

## 2020-10-25 DIAGNOSIS — I2510 Atherosclerotic heart disease of native coronary artery without angina pectoris: Secondary | ICD-10-CM

## 2020-10-25 DIAGNOSIS — I340 Nonrheumatic mitral (valve) insufficiency: Secondary | ICD-10-CM

## 2020-10-25 DIAGNOSIS — I255 Ischemic cardiomyopathy: Secondary | ICD-10-CM

## 2020-10-25 DIAGNOSIS — R0989 Other specified symptoms and signs involving the circulatory and respiratory systems: Secondary | ICD-10-CM

## 2020-10-25 DIAGNOSIS — N189 Chronic kidney disease, unspecified: Secondary | ICD-10-CM

## 2020-10-25 DIAGNOSIS — R0689 Other abnormalities of breathing: Secondary | ICD-10-CM

## 2020-10-28 DIAGNOSIS — I5033 Acute on chronic diastolic (congestive) heart failure: Secondary | ICD-10-CM | POA: Diagnosis not present

## 2020-10-28 DIAGNOSIS — I10 Essential (primary) hypertension: Secondary | ICD-10-CM | POA: Diagnosis not present

## 2020-10-28 DIAGNOSIS — M6281 Muscle weakness (generalized): Secondary | ICD-10-CM | POA: Diagnosis not present

## 2020-10-28 DIAGNOSIS — Z950 Presence of cardiac pacemaker: Secondary | ICD-10-CM | POA: Diagnosis not present

## 2020-10-28 DIAGNOSIS — Z9981 Dependence on supplemental oxygen: Secondary | ICD-10-CM | POA: Diagnosis not present

## 2020-10-28 DIAGNOSIS — M109 Gout, unspecified: Secondary | ICD-10-CM | POA: Diagnosis not present

## 2020-10-28 DIAGNOSIS — J9611 Chronic respiratory failure with hypoxia: Secondary | ICD-10-CM | POA: Diagnosis not present

## 2020-10-28 DIAGNOSIS — E1151 Type 2 diabetes mellitus with diabetic peripheral angiopathy without gangrene: Secondary | ICD-10-CM | POA: Diagnosis not present

## 2020-10-28 DIAGNOSIS — J99 Respiratory disorders in diseases classified elsewhere: Secondary | ICD-10-CM | POA: Diagnosis not present

## 2020-10-28 DIAGNOSIS — J449 Chronic obstructive pulmonary disease, unspecified: Secondary | ICD-10-CM | POA: Diagnosis not present

## 2020-10-28 DIAGNOSIS — J42 Unspecified chronic bronchitis: Secondary | ICD-10-CM | POA: Diagnosis not present

## 2020-10-28 DIAGNOSIS — I6529 Occlusion and stenosis of unspecified carotid artery: Secondary | ICD-10-CM | POA: Diagnosis not present

## 2020-10-28 DIAGNOSIS — E119 Type 2 diabetes mellitus without complications: Secondary | ICD-10-CM | POA: Diagnosis not present

## 2020-10-28 DIAGNOSIS — K5790 Diverticulosis of intestine, part unspecified, without perforation or abscess without bleeding: Secondary | ICD-10-CM | POA: Diagnosis not present

## 2020-10-28 DIAGNOSIS — Z8701 Personal history of pneumonia (recurrent): Secondary | ICD-10-CM | POA: Diagnosis not present

## 2020-10-28 DIAGNOSIS — R069 Unspecified abnormalities of breathing: Secondary | ICD-10-CM | POA: Diagnosis not present

## 2020-10-28 DIAGNOSIS — D509 Iron deficiency anemia, unspecified: Secondary | ICD-10-CM | POA: Diagnosis not present

## 2020-10-28 DIAGNOSIS — R279 Unspecified lack of coordination: Secondary | ICD-10-CM | POA: Diagnosis not present

## 2020-10-28 DIAGNOSIS — K219 Gastro-esophageal reflux disease without esophagitis: Secondary | ICD-10-CM | POA: Diagnosis not present

## 2020-10-28 DIAGNOSIS — R262 Difficulty in walking, not elsewhere classified: Secondary | ICD-10-CM | POA: Diagnosis not present

## 2020-10-28 DIAGNOSIS — R531 Weakness: Secondary | ICD-10-CM | POA: Diagnosis not present

## 2020-10-28 DIAGNOSIS — E1122 Type 2 diabetes mellitus with diabetic chronic kidney disease: Secondary | ICD-10-CM | POA: Diagnosis not present

## 2020-10-28 DIAGNOSIS — J9600 Acute respiratory failure, unspecified whether with hypoxia or hypercapnia: Secondary | ICD-10-CM | POA: Diagnosis not present

## 2020-10-28 DIAGNOSIS — R0902 Hypoxemia: Secondary | ICD-10-CM | POA: Diagnosis not present

## 2020-10-28 DIAGNOSIS — R634 Abnormal weight loss: Secondary | ICD-10-CM | POA: Diagnosis not present

## 2020-10-28 DIAGNOSIS — R0789 Other chest pain: Secondary | ICD-10-CM | POA: Diagnosis not present

## 2020-10-28 DIAGNOSIS — I2510 Atherosclerotic heart disease of native coronary artery without angina pectoris: Secondary | ICD-10-CM | POA: Diagnosis not present

## 2020-10-28 DIAGNOSIS — E039 Hypothyroidism, unspecified: Secondary | ICD-10-CM | POA: Diagnosis not present

## 2020-10-28 DIAGNOSIS — R079 Chest pain, unspecified: Secondary | ICD-10-CM | POA: Diagnosis not present

## 2020-10-28 DIAGNOSIS — I509 Heart failure, unspecified: Secondary | ICD-10-CM | POA: Diagnosis not present

## 2020-10-28 DIAGNOSIS — I214 Non-ST elevation (NSTEMI) myocardial infarction: Secondary | ICD-10-CM | POA: Diagnosis not present

## 2020-10-28 DIAGNOSIS — R059 Cough, unspecified: Secondary | ICD-10-CM | POA: Diagnosis not present

## 2020-10-28 DIAGNOSIS — R6889 Other general symptoms and signs: Secondary | ICD-10-CM | POA: Diagnosis not present

## 2020-10-28 DIAGNOSIS — M6259 Muscle wasting and atrophy, not elsewhere classified, multiple sites: Secondary | ICD-10-CM | POA: Diagnosis not present

## 2020-10-28 DIAGNOSIS — I130 Hypertensive heart and chronic kidney disease with heart failure and stage 1 through stage 4 chronic kidney disease, or unspecified chronic kidney disease: Secondary | ICD-10-CM | POA: Diagnosis not present

## 2020-10-28 DIAGNOSIS — R0689 Other abnormalities of breathing: Secondary | ICD-10-CM | POA: Diagnosis not present

## 2020-10-28 DIAGNOSIS — J849 Interstitial pulmonary disease, unspecified: Secondary | ICD-10-CM | POA: Diagnosis not present

## 2020-10-28 DIAGNOSIS — I129 Hypertensive chronic kidney disease with stage 1 through stage 4 chronic kidney disease, or unspecified chronic kidney disease: Secondary | ICD-10-CM | POA: Diagnosis not present

## 2020-10-28 DIAGNOSIS — E43 Unspecified severe protein-calorie malnutrition: Secondary | ICD-10-CM | POA: Diagnosis not present

## 2020-10-28 DIAGNOSIS — G4700 Insomnia, unspecified: Secondary | ICD-10-CM | POA: Diagnosis not present

## 2020-10-28 DIAGNOSIS — N1832 Chronic kidney disease, stage 3b: Secondary | ICD-10-CM | POA: Diagnosis not present

## 2020-10-28 DIAGNOSIS — J9621 Acute and chronic respiratory failure with hypoxia: Secondary | ICD-10-CM | POA: Diagnosis not present

## 2020-10-28 DIAGNOSIS — Z743 Need for continuous supervision: Secondary | ICD-10-CM | POA: Diagnosis not present

## 2020-10-28 DIAGNOSIS — I5022 Chronic systolic (congestive) heart failure: Secondary | ICD-10-CM | POA: Diagnosis not present

## 2020-10-28 DIAGNOSIS — R5381 Other malaise: Secondary | ICD-10-CM | POA: Diagnosis not present

## 2020-10-28 DIAGNOSIS — E785 Hyperlipidemia, unspecified: Secondary | ICD-10-CM | POA: Diagnosis not present

## 2020-10-28 DIAGNOSIS — N189 Chronic kidney disease, unspecified: Secondary | ICD-10-CM | POA: Diagnosis not present

## 2020-10-28 DIAGNOSIS — I959 Hypotension, unspecified: Secondary | ICD-10-CM | POA: Diagnosis not present

## 2020-10-28 DIAGNOSIS — J441 Chronic obstructive pulmonary disease with (acute) exacerbation: Secondary | ICD-10-CM | POA: Diagnosis not present

## 2020-10-28 DIAGNOSIS — I25119 Atherosclerotic heart disease of native coronary artery with unspecified angina pectoris: Secondary | ICD-10-CM | POA: Diagnosis not present

## 2020-10-29 DIAGNOSIS — I214 Non-ST elevation (NSTEMI) myocardial infarction: Secondary | ICD-10-CM | POA: Diagnosis not present

## 2020-10-29 DIAGNOSIS — E785 Hyperlipidemia, unspecified: Secondary | ICD-10-CM | POA: Diagnosis not present

## 2020-10-29 DIAGNOSIS — E119 Type 2 diabetes mellitus without complications: Secondary | ICD-10-CM | POA: Diagnosis not present

## 2020-10-29 DIAGNOSIS — D509 Iron deficiency anemia, unspecified: Secondary | ICD-10-CM | POA: Diagnosis not present

## 2020-10-29 DIAGNOSIS — I959 Hypotension, unspecified: Secondary | ICD-10-CM | POA: Diagnosis not present

## 2020-10-29 DIAGNOSIS — I10 Essential (primary) hypertension: Secondary | ICD-10-CM | POA: Diagnosis not present

## 2020-10-29 DIAGNOSIS — J441 Chronic obstructive pulmonary disease with (acute) exacerbation: Secondary | ICD-10-CM | POA: Diagnosis not present

## 2020-10-29 DIAGNOSIS — I25119 Atherosclerotic heart disease of native coronary artery with unspecified angina pectoris: Secondary | ICD-10-CM | POA: Diagnosis not present

## 2020-10-29 DIAGNOSIS — N1832 Chronic kidney disease, stage 3b: Secondary | ICD-10-CM | POA: Diagnosis not present

## 2020-10-29 DIAGNOSIS — I509 Heart failure, unspecified: Secondary | ICD-10-CM | POA: Diagnosis not present

## 2020-10-29 DIAGNOSIS — J9621 Acute and chronic respiratory failure with hypoxia: Secondary | ICD-10-CM | POA: Diagnosis not present

## 2020-10-29 DIAGNOSIS — K219 Gastro-esophageal reflux disease without esophagitis: Secondary | ICD-10-CM | POA: Diagnosis not present

## 2020-10-30 DIAGNOSIS — R0902 Hypoxemia: Secondary | ICD-10-CM | POA: Diagnosis not present

## 2020-10-30 DIAGNOSIS — I129 Hypertensive chronic kidney disease with stage 1 through stage 4 chronic kidney disease, or unspecified chronic kidney disease: Secondary | ICD-10-CM | POA: Diagnosis not present

## 2020-10-30 DIAGNOSIS — J9611 Chronic respiratory failure with hypoxia: Secondary | ICD-10-CM | POA: Diagnosis not present

## 2020-10-30 DIAGNOSIS — N189 Chronic kidney disease, unspecified: Secondary | ICD-10-CM | POA: Diagnosis not present

## 2020-10-30 DIAGNOSIS — E119 Type 2 diabetes mellitus without complications: Secondary | ICD-10-CM | POA: Diagnosis not present

## 2020-10-30 DIAGNOSIS — R059 Cough, unspecified: Secondary | ICD-10-CM | POA: Diagnosis not present

## 2020-11-01 DIAGNOSIS — I509 Heart failure, unspecified: Secondary | ICD-10-CM | POA: Diagnosis not present

## 2020-11-01 DIAGNOSIS — J9621 Acute and chronic respiratory failure with hypoxia: Secondary | ICD-10-CM | POA: Diagnosis not present

## 2020-11-01 DIAGNOSIS — E119 Type 2 diabetes mellitus without complications: Secondary | ICD-10-CM | POA: Diagnosis not present

## 2020-11-01 DIAGNOSIS — I10 Essential (primary) hypertension: Secondary | ICD-10-CM | POA: Diagnosis not present

## 2020-11-01 DIAGNOSIS — I25119 Atherosclerotic heart disease of native coronary artery with unspecified angina pectoris: Secondary | ICD-10-CM | POA: Diagnosis not present

## 2020-11-01 DIAGNOSIS — J441 Chronic obstructive pulmonary disease with (acute) exacerbation: Secondary | ICD-10-CM | POA: Diagnosis not present

## 2020-11-02 DIAGNOSIS — I25119 Atherosclerotic heart disease of native coronary artery with unspecified angina pectoris: Secondary | ICD-10-CM | POA: Diagnosis not present

## 2020-11-02 DIAGNOSIS — D509 Iron deficiency anemia, unspecified: Secondary | ICD-10-CM | POA: Diagnosis not present

## 2020-11-02 DIAGNOSIS — I959 Hypotension, unspecified: Secondary | ICD-10-CM | POA: Diagnosis not present

## 2020-11-02 DIAGNOSIS — E785 Hyperlipidemia, unspecified: Secondary | ICD-10-CM | POA: Diagnosis not present

## 2020-11-02 DIAGNOSIS — I509 Heart failure, unspecified: Secondary | ICD-10-CM | POA: Diagnosis not present

## 2020-11-02 DIAGNOSIS — J441 Chronic obstructive pulmonary disease with (acute) exacerbation: Secondary | ICD-10-CM | POA: Diagnosis not present

## 2020-11-02 DIAGNOSIS — K219 Gastro-esophageal reflux disease without esophagitis: Secondary | ICD-10-CM | POA: Diagnosis not present

## 2020-11-02 DIAGNOSIS — J9621 Acute and chronic respiratory failure with hypoxia: Secondary | ICD-10-CM | POA: Diagnosis not present

## 2020-11-02 DIAGNOSIS — I214 Non-ST elevation (NSTEMI) myocardial infarction: Secondary | ICD-10-CM | POA: Diagnosis not present

## 2020-11-02 DIAGNOSIS — E119 Type 2 diabetes mellitus without complications: Secondary | ICD-10-CM | POA: Diagnosis not present

## 2020-11-05 DIAGNOSIS — J9600 Acute respiratory failure, unspecified whether with hypoxia or hypercapnia: Secondary | ICD-10-CM | POA: Diagnosis not present

## 2020-11-05 DIAGNOSIS — Z743 Need for continuous supervision: Secondary | ICD-10-CM | POA: Diagnosis not present

## 2020-11-09 DEATH — deceased

## 2022-02-28 IMAGING — CT CT ABDOMEN W/O CM
2 of 4 series · 16 of 46 positions shown, 18 images · non-contrast
Comparison: 08/03/2018 CT abdomen/pelvis.

CLINICAL DATA: Epigastric abdominal pain radiating to the umbilical
region with nausea and diarrhea. Symptoms are chronic and 1 year
induration.

EXAM:
CT ABDOMEN WITHOUT CONTRAST
TECHNIQUE: Multidetector CT imaging of the abdomen was performed following the
standard protocol without IV contrast.

[Series 2: axial st · axial · 0.72mm/px · z∈[-263,-28]mm · 13 of 53 slices shown, 15 images]
[im 3/53  soft-tissue]
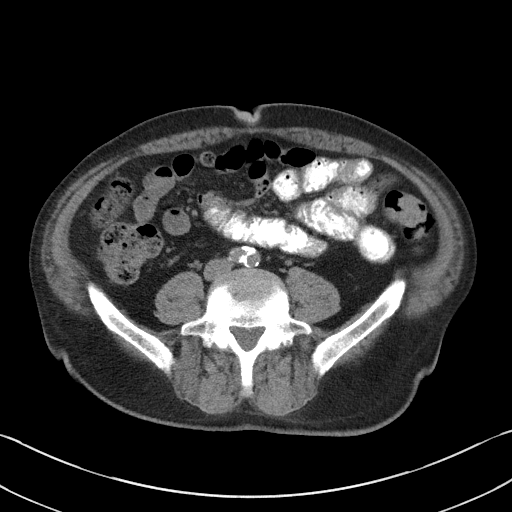
[im 3/53  bone]
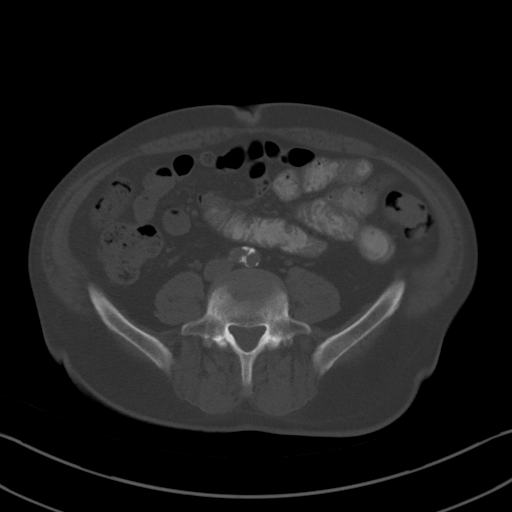
[im 9/53  soft-tissue]
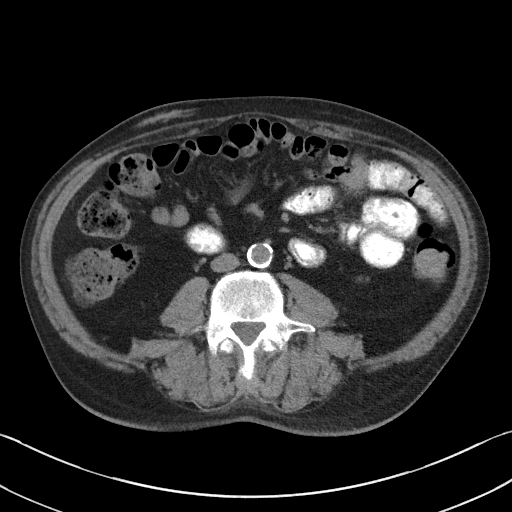
[im 11/53  soft-tissue]
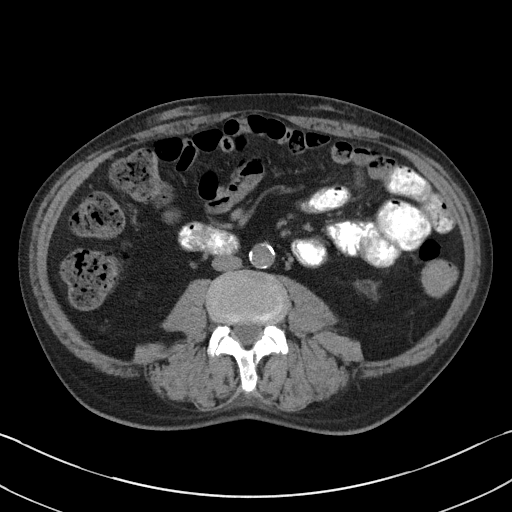
[im 14/53  soft-tissue]
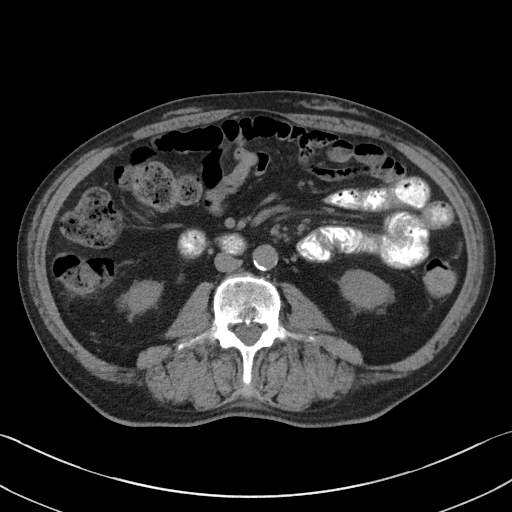
[im 20/53  soft-tissue]
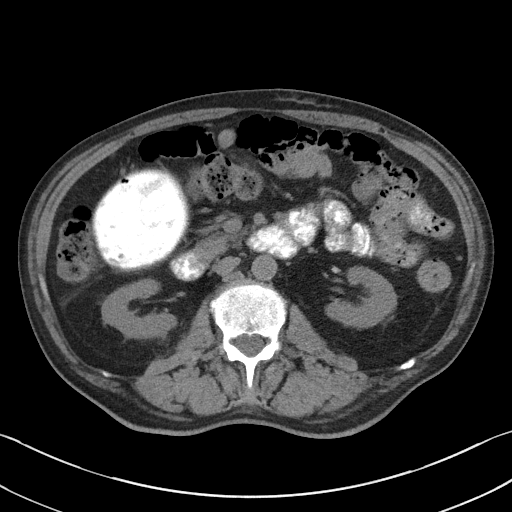
[im 22/53  soft-tissue]
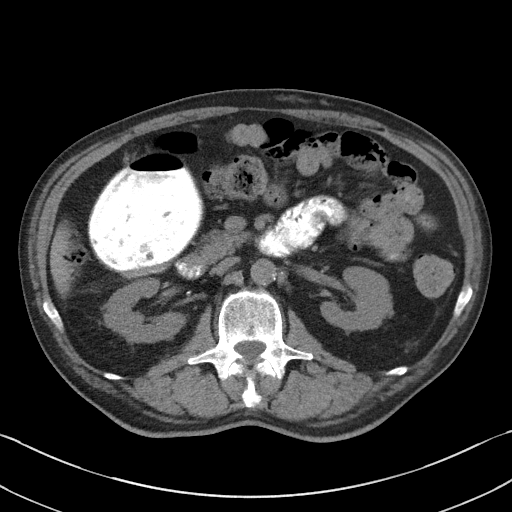
[im 28/53  soft-tissue]
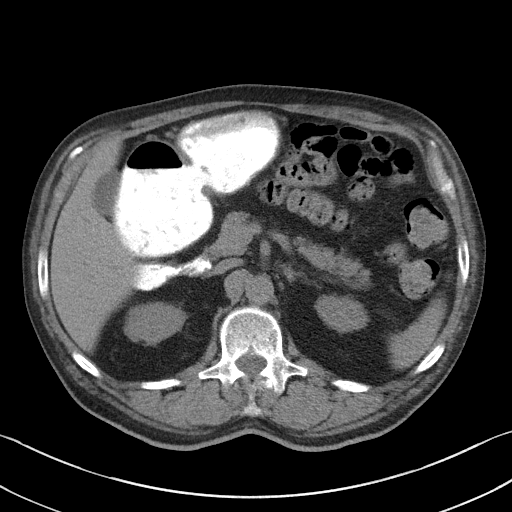
[im 31/53  soft-tissue]
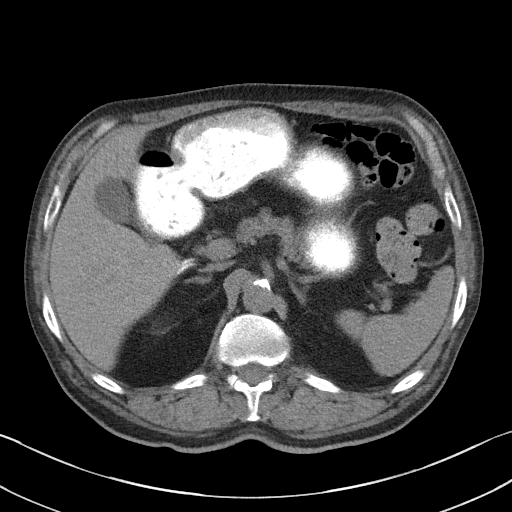
[im 33/53  soft-tissue]
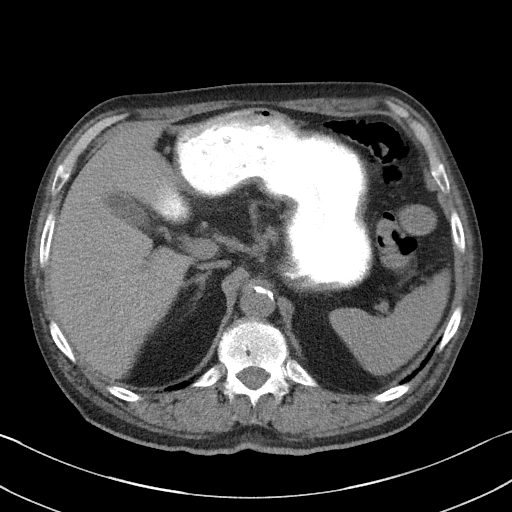
[im 33/53  bone]
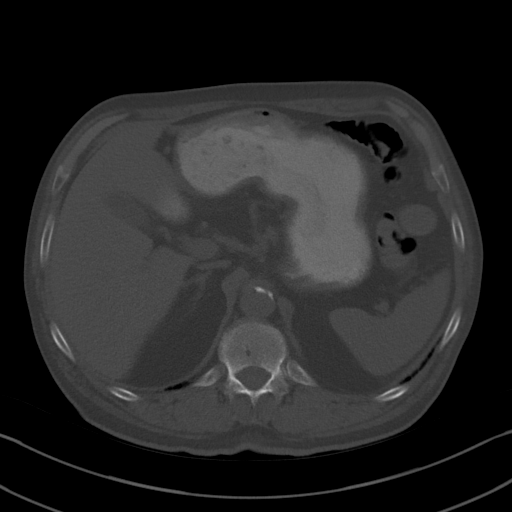
[im 39/53  soft-tissue]
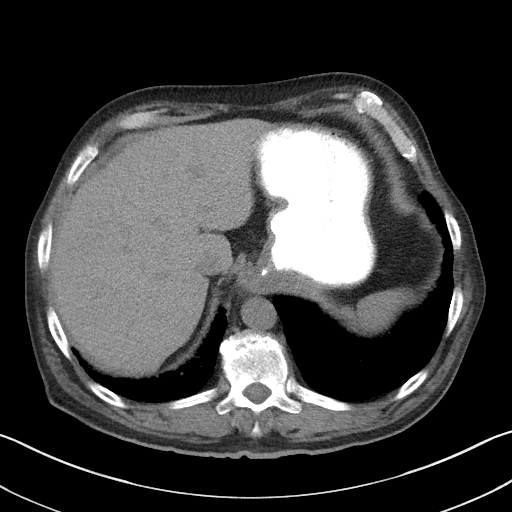
[im 42/53  soft-tissue]
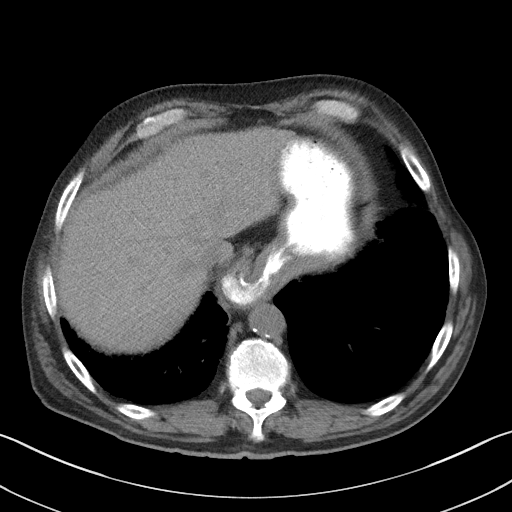
[im 44/53  soft-tissue]
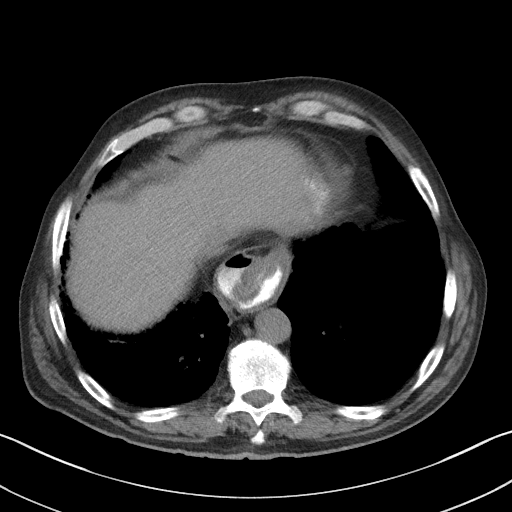
[im 50/53  soft-tissue]
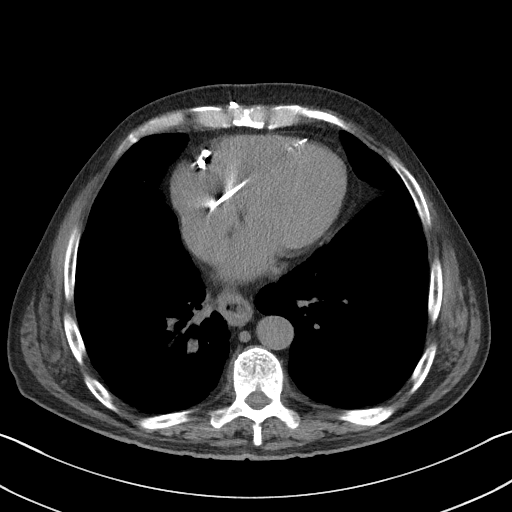

[Series 5: coronal st · coronal · 0.57mm/px · 3 of 94 slices shown]
[im 32/94  soft-tissue]
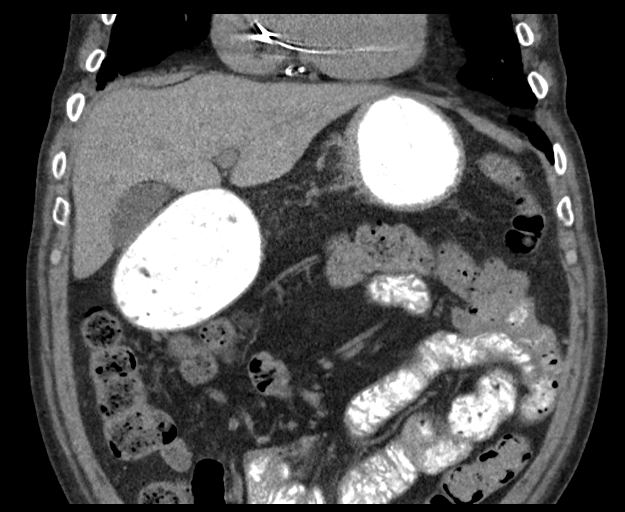
[im 42/94  soft-tissue]
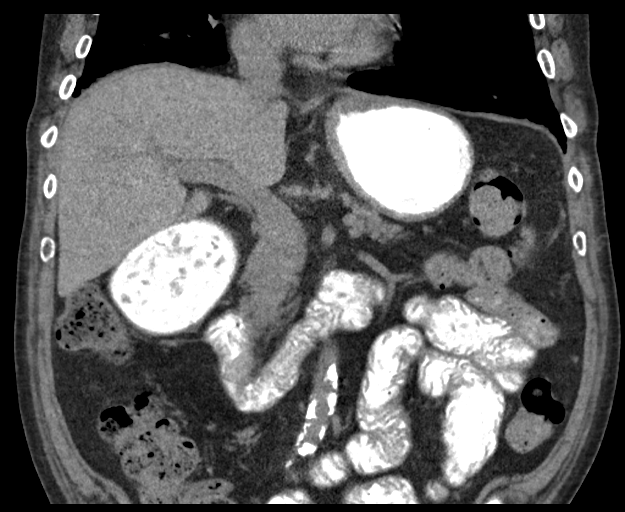
[im 52/94  soft-tissue]
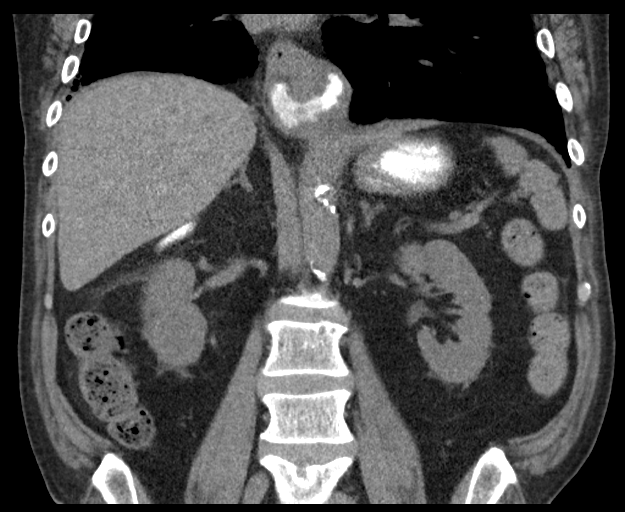

[16 of 46 positions shown; findings below may reference images not displayed]

FINDINGS: Lower chest: Patchy reticulation, ground-glass opacity and traction
bronchiectasis at the right greater than left lung bases, not
definitely changed since 08/03/2018 CT. No acute abnormality at the
lung bases. Intact lower sternotomy wire. Coronary atherosclerosis.
Pacemaker tip seen in the right ventricular apex.

Hepatobiliary: Normal liver with no liver mass. Normal gallbladder
with no radiopaque cholelithiasis. No biliary ductal dilatation.

Pancreas: Normal, with no mass or duct dilation.

Spleen: Normal size. No mass.

Adrenals/Urinary Tract: Normal adrenals. No renal stones. No
hydronephrosis. No contour deforming renal masses.

Stomach/Bowel: Prior Nissen fundoplication with recurrent small
hiatal hernia, unchanged. No acute gastric abnormality. Visualized
small and large bowel is normal caliber, with no bowel wall
thickening. Oral contrast transits to the mid small bowel.

Vascular/Lymphatic: Atherosclerotic nonaneurysmal abdominal aorta.
No pathologically enlarged lymph nodes in the abdomen.

Other: No pneumoperitoneum, ascites or focal fluid collection.

Musculoskeletal: No aggressive appearing focal osseous lesions. Mild
thoracolumbar spondylosis with chronic large superior endplate L5
Schmorl's node.
IMPRESSION: 1. No acute abnormality.
2. Prior Nissen fundoplication with recurrent small hiatal hernia.
3. Nonspecific pulmonary fibrosis at the right greater the left lung
bases, not appreciably changed since 08/03/2018 CT. High-resolution
chest CT may be obtained for further evaluation as clinically
warranted.
4. Aortic Atherosclerosis ([id number]).
